# Patient Record
Sex: Male | Born: 1965 | Race: White | Hispanic: No | Marital: Married | Smoking: Never smoker
Health system: Southern US, Community
[De-identification: ages and names within clinical notes are randomized; demographics above are authoritative.]

## PROBLEM LIST (undated history)

## (undated) DIAGNOSIS — E7800 Pure hypercholesterolemia, unspecified: Secondary | ICD-10-CM

## (undated) DIAGNOSIS — S83242A Other tear of medial meniscus, current injury, left knee, initial encounter: Secondary | ICD-10-CM

## (undated) DIAGNOSIS — E663 Overweight: Secondary | ICD-10-CM

## (undated) DIAGNOSIS — M722 Plantar fascial fibromatosis: Secondary | ICD-10-CM

## (undated) DIAGNOSIS — M75100 Unspecified rotator cuff tear or rupture of unspecified shoulder, not specified as traumatic: Secondary | ICD-10-CM

## (undated) HISTORY — DX: Other tear of medial meniscus, current injury, left knee, initial encounter: S83.242A

## (undated) HISTORY — DX: Overweight: E66.3

## (undated) HISTORY — DX: Plantar fascial fibromatosis: M72.2

## (undated) HISTORY — PX: MENISCUS REPAIR: SHX5179

## (undated) HISTORY — PX: ROTATOR CUFF REPAIR: SHX139

## (undated) HISTORY — PX: TONSILLECTOMY: SUR1361

---
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
## 2009-02-08 ENCOUNTER — Ambulatory Visit: Payer: Self-pay | Admitting: General Practice

## 2010-01-22 ENCOUNTER — Ambulatory Visit: Payer: Self-pay | Admitting: General Practice

## 2012-05-20 ENCOUNTER — Ambulatory Visit: Payer: Self-pay | Admitting: General Practice

## 2012-05-21 ENCOUNTER — Ambulatory Visit: Payer: Self-pay | Admitting: General Practice

## 2012-05-28 ENCOUNTER — Ambulatory Visit: Payer: Self-pay | Admitting: Sports Medicine

## 2012-12-17 ENCOUNTER — Ambulatory Visit: Payer: Self-pay | Admitting: General Practice

## 2013-01-20 DIAGNOSIS — M75100 Unspecified rotator cuff tear or rupture of unspecified shoulder, not specified as traumatic: Secondary | ICD-10-CM

## 2013-01-20 HISTORY — DX: Unspecified rotator cuff tear or rupture of unspecified shoulder, not specified as traumatic: M75.100

## 2013-12-29 ENCOUNTER — Ambulatory Visit: Payer: Self-pay | Admitting: General Practice

## 2014-03-20 ENCOUNTER — Ambulatory Visit: Payer: Self-pay | Admitting: General Practice

## 2015-01-09 ENCOUNTER — Ambulatory Visit
Admit: 2015-01-09 | Disposition: A | Discharge status: Home / Self Care | Payer: Self-pay | Attending: General Practice | Admitting: General Practice

## 2015-05-02 ENCOUNTER — Other Ambulatory Visit: Payer: Self-pay | Admitting: Physician Assistant

## 2015-05-02 DIAGNOSIS — M25512 Pain in left shoulder: Secondary | ICD-10-CM

## 2015-05-08 DIAGNOSIS — M5030 Other cervical disc degeneration, unspecified cervical region: Secondary | ICD-10-CM | POA: Insufficient documentation

## 2015-05-08 DIAGNOSIS — M62838 Other muscle spasm: Secondary | ICD-10-CM | POA: Insufficient documentation

## 2015-05-10 ENCOUNTER — Ambulatory Visit: Payer: BLUE CROSS/BLUE SHIELD

## 2016-02-21 ENCOUNTER — Encounter: Payer: Self-pay | Admitting: *Deleted

## 2016-02-22 ENCOUNTER — Encounter: Payer: Self-pay | Admitting: *Deleted

## 2016-02-22 ENCOUNTER — Ambulatory Visit
Admission: RE | Admit: 2016-02-22 | Discharge: 2016-02-22 | Disposition: A | Discharge status: Home / Self Care | Payer: BLUE CROSS/BLUE SHIELD | Source: Ambulatory Visit | Attending: Gastroenterology | Admitting: Gastroenterology

## 2016-02-22 ENCOUNTER — Ambulatory Visit: Payer: BLUE CROSS/BLUE SHIELD | Admitting: Anesthesiology

## 2016-02-22 ENCOUNTER — Encounter
Admission: RE | Disposition: A | Discharge status: Home / Self Care | Payer: Self-pay | Source: Ambulatory Visit | Attending: Gastroenterology

## 2016-02-22 DIAGNOSIS — E7800 Pure hypercholesterolemia, unspecified: Secondary | ICD-10-CM | POA: Insufficient documentation

## 2016-02-22 DIAGNOSIS — K5730 Diverticulosis of large intestine without perforation or abscess without bleeding: Secondary | ICD-10-CM | POA: Insufficient documentation

## 2016-02-22 DIAGNOSIS — K529 Noninfective gastroenteritis and colitis, unspecified: Secondary | ICD-10-CM | POA: Diagnosis not present

## 2016-02-22 DIAGNOSIS — Z1211 Encounter for screening for malignant neoplasm of colon: Secondary | ICD-10-CM | POA: Insufficient documentation

## 2016-02-22 DIAGNOSIS — K640 First degree hemorrhoids: Secondary | ICD-10-CM | POA: Insufficient documentation

## 2016-02-22 HISTORY — PX: COLONOSCOPY WITH PROPOFOL: SHX5780

## 2016-02-22 HISTORY — DX: Pure hypercholesterolemia, unspecified: E78.00

## 2016-02-22 LAB — HM COLONOSCOPY

## 2016-02-22 SURGERY — COLONOSCOPY WITH PROPOFOL
Anesthesia: General

## 2016-02-22 MED ORDER — SODIUM CHLORIDE 0.9 % IV SOLN
INTRAVENOUS | Status: DC
  Administered 2016-02-22 (×2): via INTRAVENOUS

## 2016-02-22 MED ORDER — PROPOFOL 500 MG/50ML IV EMUL
INTRAVENOUS | Status: DC | PRN
  Administered 2016-02-22: 150 ug/kg/min via INTRAVENOUS

## 2016-02-22 MED ORDER — SODIUM CHLORIDE 0.9 % IV SOLN: INTRAVENOUS | Status: DC

## 2016-02-22 MED ORDER — PROPOFOL 10 MG/ML IV BOLUS
INTRAVENOUS | Status: DC | PRN
  Administered 2016-02-22: 80 mg via INTRAVENOUS

## 2016-02-22 MED ORDER — MIDAZOLAM HCL 2 MG/2ML IJ SOLN
INTRAMUSCULAR | Status: DC | PRN
  Administered 2016-02-22: 1 mg via INTRAVENOUS

## 2016-02-22 MED ORDER — FENTANYL CITRATE (PF) 100 MCG/2ML IJ SOLN
INTRAMUSCULAR | Status: DC | PRN
  Administered 2016-02-22: 50 ug via INTRAVENOUS

## 2016-02-22 NOTE — Anesthesia Procedure Notes (Signed)
Date/Time: 02/22/2016 9:54 AM Performed by: Junious SilkNOLES, Rozelle Caudle Pre-anesthesia Checklist: Patient identified, Emergency Drugs available, Suction available, Patient being monitored and Timeout performed Oxygen Delivery Method: Nasal cannula

## 2016-02-22 NOTE — Anesthesia Postprocedure Evaluation (Signed)
Anesthesia Post Note  Patient: Kevin Berry  Procedure(s) Performed: Procedure(s) (LRB): COLONOSCOPY WITH PROPOFOL (N/A)  Patient location during evaluation: Endoscopy Anesthesia Type: General Level of consciousness: awake and alert Pain management: pain level controlled Vital Signs Assessment: post-procedure vital signs reviewed and stable Respiratory status: spontaneous breathing, nonlabored ventilation, respiratory function stable and patient connected to nasal cannula oxygen Cardiovascular status: blood pressure returned to baseline and stable Postop Assessment: no signs of nausea or vomiting Anesthetic complications: no    Last Vitals:  Filed Vitals:   02/22/16 1030 02/22/16 1040  BP: 97/69 105/74  Pulse: 59 56  Temp:    Resp: 18 19    Last Pain: There were no vitals filed for this visit.               Graceann Boileau S

## 2016-02-22 NOTE — H&P (Signed)
Outpatient short stay form Pre-procedure 02/22/2016 9:26 AM Christena DeemMartin U Chivon Lepage MD  Primary Physician: Dr. Tarri AbernethyJoseph Rabinowitz  Reason for visit:  Screening colonoscopy  History of present illness:  Patient is a 50 year old male presenting today for screening colonoscopy. He tolerated his prep well. He takes no aspirin or blood thinning products.    Current facility-administered medications:  .  0.9 %  sodium chloride infusion, , Intravenous, Continuous, Christena DeemMartin U Jerard Bays, MD, Last Rate: 20 mL/hr at 02/22/16 0918 .  0.9 %  sodium chloride infusion, , Intravenous, Continuous, Christena DeemMartin U Kalysta Kneisley, MD  Prescriptions prior to admission  Medication Sig Dispense Refill Last Dose  . Multiple Vitamins-Minerals (MULTIVITAMIN PO) Take by mouth.   Past Week at Unknown time  . Omega-3 Fatty Acids (FISH OIL PO) Take by mouth.   Past Week at Unknown time     Allergies  Allergen Reactions  . Sulfa Antibiotics Other (See Comments)    Fever     Past Medical History  Diagnosis Date  . Hypercholesterolemia     Review of systems:      Physical Exam    Heart and lungs: Regular rate and rhythm without rub or gallop, lungs are bilaterally clear.    HEENT: Normocephalic atraumatic eyes are anicteric    Other:     Pertinant exam for procedure: Soft nontender nondistended bowel sounds positive normoactive.    Planned proceedures: Colonoscopy and indicated procedures. I have discussed the risks benefits and complications of procedures to include not limited to bleeding, infection, perforation and the risk of sedation and the patient wishes to proceed.    Christena DeemMartin U Skylen Danielsen, MD Gastroenterology 02/22/2016  9:26 AM

## 2016-02-22 NOTE — Transfer of Care (Signed)
Immediate Anesthesia Transfer of Care Note  Patient: Kevin Berry  Procedure(s) Performed: Procedure(s): COLONOSCOPY WITH PROPOFOL (N/A)  Patient Location: PACU  Anesthesia Type:General  Level of Consciousness: sedated  Airway & Oxygen Therapy: Patient Spontanous Breathing and Patient connected to nasal cannula oxygen  Post-op Assessment: Report given to RN and Post -op Vital signs reviewed and stable  Post vital signs: Reviewed and stable  Last Vitals:  Filed Vitals:   02/22/16 0901  BP: 128/76  Pulse: 66  Temp: 36.7 C  Resp: 24    Last Pain: There were no vitals filed for this visit.       Complications: No apparent anesthesia complications

## 2016-02-22 NOTE — Anesthesia Preprocedure Evaluation (Signed)
Anesthesia Evaluation  Patient identified by MRN, date of birth, ID band Patient awake    Reviewed: Allergy & Precautions, NPO status , Patient's Chart, lab work & pertinent test results, reviewed documented beta blocker date and time   Airway Mallampati: II  TM Distance: >3 FB     Dental  (+) Chipped   Pulmonary          Cardiovascular     Neuro/Psych    GI/Hepatic   Endo/Other    Renal/GU      Musculoskeletal   Abdominal   Peds  Hematology   Anesthesia Other Findings   Reproductive/Obstetrics                             Anesthesia Physical Anesthesia Plan  ASA: II  Anesthesia Plan: General   Post-op Pain Management:    Induction: Intravenous  Airway Management Planned: Nasal Cannula  Additional Equipment:   Intra-op Plan:   Post-operative Plan:   Informed Consent: I have reviewed the patients History and Physical, chart, labs and discussed the procedure including the risks, benefits and alternatives for the proposed anesthesia with the patient or authorized representative who has indicated his/her understanding and acceptance.     Plan Discussed with: CRNA  Anesthesia Plan Comments:         Anesthesia Quick Evaluation  

## 2016-02-22 NOTE — Op Note (Signed)
[facility] Gastroenterology Patient Name: Kevin Berry Procedure Date: 02/22/2016 9:37 AM MRN: [id number] Account #: [id number] Date of Birth: [birth year] Admit Type: Outpatient Age: 50 Room: [facility] ENDO ROOM 3 Gender: Male Note Status: Finalized Procedure:            Colonoscopy Indications:          Screening for colorectal malignant neoplasm Providers:            Christena Deem, MD Referring MD:         Bunnie Philips. Teressa Senter (Referring MD), Jabier Mutton, MD (Referring MD) Medicines:            Monitored Anesthesia Care Complications:        No immediate complications. Procedure:            Pre-Anesthesia Assessment:                       - ASA Grade Assessment: II - A patient with mild                        systemic disease.                       After obtaining informed consent, the colonoscope was                        passed under direct vision. Throughout the procedure,                        the patient's blood pressure, pulse, and oxygen                        saturations were monitored continuously. The                        Colonoscope was introduced through the anus and                        advanced to the the terminal ileum. The quality of the                        bowel preparation was good. Findings:      A localized area of variant mucosa/texture change was found at the       posterior edge of the innside of the ileocecal valve. Biopsies were       taken with a cold forceps for histology.      The remainder of the exam in the terminal ileum was normal.      A few small-mouthed diverticula were found in the sigmoid colon and       descending colon.      Non-bleeding internal hemorrhoids were found during retroflexion and       during anoscopy. The hemorrhoids were small and Grade I (internal       hemorrhoids that do not prolapse).      The digital rectal exam was normal.      No additional abnormalities were  found on retroflexion. Impression:           - Mucosa at the ileocecal valve. Biopsied.                       -  Diverticulosis in the sigmoid colon and in the                        descending colon.                       - Non-bleeding internal hemorrhoids. Recommendation:       - Discharge patient to home. Procedure Code(s):    --- Professional ---                       [phone], Colonoscopy, flexible; with biopsy, single or                        multiple Diagnosis Code(s):    --- Professional ---                       Z12.11, Encounter for screening for malignant neoplasm                        of colon                       K64.0, First degree hemorrhoids                       K57.30, Diverticulosis of large intestine without                        perforation or abscess without bleeding CPT copyright 2016 American Medical Association. All rights reserved. The codes documented in this report are preliminary and upon coder review may  be revised to meet current compliance requirements. Christena DeemMartin U Skulskie, MD 02/22/2016 10:12:08 AM This report has been signed electronically. Number of Addenda: 0 Note Initiated On: 02/22/2016 9:37 AM Scope Withdrawal Time: 0 hours 12 minutes 21 seconds  Total Procedure Duration: 0 hours 17 minutes 5 seconds       [facility]

## 2016-02-25 ENCOUNTER — Encounter: Payer: Self-pay | Admitting: Gastroenterology

## 2016-02-25 LAB — SURGICAL PATHOLOGY

## 2016-04-10 LAB — PSA: PSA: 1.1

## 2016-04-10 LAB — LIPID PANEL
Cholesterol: 215 — AB (ref 0–200)
HDL: 46 (ref 35–70)
LDL Cholesterol: 126
Triglycerides: 213 — AB (ref 40–160)

## 2017-05-18 LAB — PSA: PSA: 1.1

## 2017-05-18 LAB — LIPID PANEL
Cholesterol: 216 — AB (ref 0–200)
HDL: 40 (ref 35–70)
LDL Cholesterol: 106
Triglycerides: 348 — AB (ref 40–160)

## 2018-04-27 LAB — LIPID PANEL
Cholesterol: 195 (ref 0–200)
Cholesterol: 195 (ref 0–200)
HDL: 39 (ref 35–70)
HDL: 39 (ref 35–70)
LDL Cholesterol: 125
LDL Cholesterol: 125
Triglycerides: 155 (ref 40–160)
Triglycerides: 155 (ref 40–160)

## 2018-04-27 LAB — PSA: PSA: 0.8

## 2019-01-18 ENCOUNTER — Ambulatory Visit
Admission: RE | Admit: 2019-01-18 | Discharge: 2019-01-18 | Disposition: A | Discharge status: Home / Self Care | Payer: BLUE CROSS/BLUE SHIELD | Attending: Internal Medicine | Admitting: Internal Medicine

## 2019-01-18 ENCOUNTER — Ambulatory Visit
Admission: RE | Admit: 2019-01-18 | Discharge: 2019-01-18 | Disposition: A | Discharge status: Home / Self Care | Payer: BLUE CROSS/BLUE SHIELD | Source: Ambulatory Visit | Attending: Internal Medicine | Admitting: Internal Medicine

## 2019-01-18 ENCOUNTER — Other Ambulatory Visit: Payer: Self-pay

## 2019-01-18 ENCOUNTER — Other Ambulatory Visit: Payer: Self-pay | Admitting: Internal Medicine

## 2019-01-18 DIAGNOSIS — R52 Pain, unspecified: Secondary | ICD-10-CM

## 2019-01-18 DIAGNOSIS — M25532 Pain in left wrist: Secondary | ICD-10-CM | POA: Insufficient documentation

## 2019-03-14 DIAGNOSIS — M25532 Pain in left wrist: Secondary | ICD-10-CM | POA: Insufficient documentation

## 2019-04-05 ENCOUNTER — Other Ambulatory Visit: Payer: Self-pay

## 2019-04-05 ENCOUNTER — Ambulatory Visit: Payer: 59

## 2019-04-05 DIAGNOSIS — Z0189 Encounter for other specified special examinations: Secondary | ICD-10-CM

## 2019-04-05 LAB — POCT URINALYSIS DIPSTICK
Bilirubin, UA: NEGATIVE
Blood, UA: NEGATIVE
Glucose, UA: NEGATIVE
Nitrite, UA: NEGATIVE
Protein, UA: POSITIVE — AB
Spec Grav, UA: 1.025 (ref 1.010–1.025)
Urobilinogen, UA: 0.2 E.U./dL
pH, UA: 6 (ref 5.0–8.0)

## 2019-04-06 LAB — CMP12+LP+TP+TSH+6AC+PSA+CBC…
ALT: 17 IU/L (ref 0–44)
AST: 16 IU/L (ref 0–40)
Albumin/Globulin Ratio: 2 (ref 1.2–2.2)
Albumin: 4.3 g/dL (ref 3.8–4.9)
Alkaline Phosphatase: 44 IU/L (ref 39–117)
BUN/Creatinine Ratio: 16 (ref 9–20)
BUN: 17 mg/dL (ref 6–24)
Basophils Absolute: 0.1 10*3/uL (ref 0.0–0.2)
Basos: 1 %
Bilirubin Total: 0.6 mg/dL (ref 0.0–1.2)
Calcium: 8.9 mg/dL (ref 8.7–10.2)
Chloride: 106 mmol/L (ref 96–106)
Chol/HDL Ratio: 5.3 ratio — ABNORMAL HIGH (ref 0.0–5.0)
Cholesterol, Total: 202 mg/dL — ABNORMAL HIGH (ref 100–199)
Creatinine, Ser: 1.06 mg/dL (ref 0.76–1.27)
EOS (ABSOLUTE): 0.2 10*3/uL (ref 0.0–0.4)
Eos: 5 %
Estimated CHD Risk: 1.1 times avg. — ABNORMAL HIGH (ref 0.0–1.0)
Free Thyroxine Index: 1.4 (ref 1.2–4.9)
GFR calc Af Amer: 92 mL/min/{1.73_m2} (ref >59)
GFR calc non Af Amer: 79 mL/min/{1.73_m2} (ref >59)
GGT: 19 IU/L (ref 0–65)
Globulin, Total: 2.2 g/dL (ref 1.5–4.5)
Glucose: 86 mg/dL (ref 65–99)
HDL: 38 mg/dL — ABNORMAL LOW (ref >39)
Hematocrit: 46.4 % (ref 37.5–51.0)
Hemoglobin: 15.8 g/dL (ref 13.0–17.7)
Immature Grans (Abs): 0 10*3/uL (ref 0.0–0.1)
Immature Granulocytes: 0 %
Iron: 143 ug/dL (ref 38–169)
LDH: 164 IU/L (ref 121–224)
LDL Calculated: 133 mg/dL — ABNORMAL HIGH (ref 0–99)
Lymphocytes Absolute: 1.5 10*3/uL (ref 0.7–3.1)
Lymphs: 33 %
MCH: 31 pg (ref 26.6–33.0)
MCHC: 34.1 g/dL (ref 31.5–35.7)
MCV: 91 fL (ref 79–97)
Monocytes Absolute: 0.5 10*3/uL (ref 0.1–0.9)
Monocytes: 12 %
Neutrophils Absolute: 2.3 10*3/uL (ref 1.4–7.0)
Neutrophils: 49 %
Phosphorus: 3 mg/dL (ref 2.8–4.1)
Platelets: 250 10*3/uL (ref 150–450)
Potassium: 4.5 mmol/L (ref 3.5–5.2)
Prostate Specific Ag, Serum: 0.7 ng/mL (ref 0.0–4.0)
RBC: 5.1 x10E6/uL (ref 4.14–5.80)
RDW: 12.4 % (ref 11.6–15.4)
Sodium: 143 mmol/L (ref 134–144)
T3 Uptake Ratio: 30 % (ref 24–39)
T4, Total: 4.7 ug/dL (ref 4.5–12.0)
TSH: 1.43 u[IU]/mL (ref 0.450–4.500)
Total Protein: 6.5 g/dL (ref 6.0–8.5)
Triglycerides: 155 mg/dL — ABNORMAL HIGH (ref 0–149)
Uric Acid: 6.5 mg/dL (ref 3.7–8.6)
VLDL Cholesterol Cal: 31 mg/dL (ref 5–40)
WBC: 4.7 10*3/uL (ref 3.4–10.8)

## 2019-04-07 ENCOUNTER — Ambulatory Visit: Payer: 59 | Admitting: Internal Medicine

## 2019-04-07 ENCOUNTER — Other Ambulatory Visit: Payer: Self-pay

## 2019-04-07 ENCOUNTER — Encounter: Payer: Self-pay | Admitting: Internal Medicine

## 2019-04-07 VITALS — BP 114/82 | HR 77 | Temp 97.3°F | Resp 14 | Ht 74.0 in | Wt 211.0 lb

## 2019-04-07 DIAGNOSIS — R809 Proteinuria, unspecified: Secondary | ICD-10-CM

## 2019-04-07 DIAGNOSIS — R82998 Other abnormal findings in urine: Secondary | ICD-10-CM

## 2019-04-07 DIAGNOSIS — E663 Overweight: Secondary | ICD-10-CM

## 2019-04-07 DIAGNOSIS — S6982XA Other specified injuries of left wrist, hand and finger(s), initial encounter: Secondary | ICD-10-CM | POA: Insufficient documentation

## 2019-04-07 DIAGNOSIS — S6982XD Other specified injuries of left wrist, hand and finger(s), subsequent encounter: Secondary | ICD-10-CM

## 2019-04-07 DIAGNOSIS — E7849 Other hyperlipidemia: Secondary | ICD-10-CM | POA: Insufficient documentation

## 2019-04-07 LAB — POCT URINALYSIS DIPSTICK
Bilirubin, UA: NEGATIVE
Blood, UA: NEGATIVE
Glucose, UA: NEGATIVE
Nitrite, UA: NEGATIVE
Protein, UA: NEGATIVE
Spec Grav, UA: 1.015 (ref 1.010–1.025)
Urobilinogen, UA: 0.2 E.U./dL
pH, UA: 7 (ref 5.0–8.0)

## 2019-04-07 NOTE — Progress Notes (Signed)
Kevin Berry  - 53 y.o. male who presents for annual physical evaluation He was tested 6/20 at [facility]), and was negative for Covid, went due to secondary exposure concern, not direct.  I last saw for f/u wrist pain - 5/14, concern for TFCC injury and referred to ortho. He saw ortho and they treated with splint, agreed felt a TFCC concern and if not improving on f/u planned in August, may do injection or MRI at that point. He thinks it has slowly improved some to date, but still not resolved.  No other complaints, denies any recent CP, palpitations, SOB, abdominal pains, change in bowel habits, dark/black stools, vision changes, recent fevers or Covid concerning sx'Kevin Berry, up 2X/night to urinate, normal for him, no urgency, frequency concerns, no dysuria, no h/o prostate issues, no LE swelling, no joint swellings, his wrist is slowly improving as above noted, no other concerning sx'Kevin Berry noted.  Exercise - does daily Diet - notes tries to watch weight and eat healthy, takes protein shakes  Allergies  Allergen Reactions  . Sulfa Antibiotics Other (See Comments)    Fever    Meds reviewed Current Outpatient Medications on File Prior to Visit  Medication Sig Dispense Refill  . Multiple Vitamins-Minerals (MULTIVITAMIN PO) Take by mouth.    . Omega-3 Fatty Acids (FISH OIL PO) Take by mouth.     No current facility-administered medications on file prior to visit.      Social History   Tobacco Use  Smoking Status Never Smoker  Smokeless Tobacco Never Used     FH - M - mac degen, F - DM,low thyroid,   O - NAD, masked BP 114/82 (BP Location: Right Arm, Patient Position: Sitting, Cuff Size: Large)   Pulse 77   Temp (!) 97.3 F (36.3 C) (Oral)   Resp 14   Ht _0  (1.88 m)   Wt 211 lb (95.7 kg)   SpO2 95%   BMI 27.09 kg/m   Weight - 207 - 04/2018 visit  HEENT - sclera anicteric, + glasses, PERRL, EOMI, conj - non-inj'ed, TM'Kevin Berry and canals clear Neck - supple, no adenopathy, no TM, carotids 2+ and  =, no bruits Car - RRR without m/g/r Pulm- CTA without wheeze or rales Abd - soft, NT, ND, BS+, no obvious HSM, no masses Back - no CVA tenderness Skin- no new lesions of concern on exposed areas, denied otherwise Ext - no LE edema, no active joints, left wrist limited when testing RAMs, not fully re-examine left wrist in detail today GU - no swelling in inguinal/suprapubic region, NT, prostate not enlarged, NT, no nodules Neuro - affect was not flat, appropriate with conversation  Grossly non-focal with good strength on testing, sensation intact to LT in distal extremities, Romberg neg, no pronator drift, good balance on one foot, good finger to nose, good RAMs on right, left limited with wrist concern.  Labs reviewed - had small protein and leukocytes, 1+ ketone on urine initial test, repeated this am in office - still samll leukocytes, protein neg, still 1+ ketone, TC - 202, TG - 155, HDL - 38, LDL - 133 [phone] on check 04/2018), PSA - 0.7  ECG reviewed - no concerning changes from prior ECG Colonoscopy screening discussed and reviewed, last one 02/2016 and repeat \rec'ed in 10 years for screening  Ass/Plan - 1.  Hyperlipidemia - very small worsening in numbers noted  Educated on the role of medications, risk/benefits and a statin discussed as possible addition. Cardiac risk profile low  risk otherwise, and will continue to monitor short term. Noted the potential benefits of a statin and if reads more about them and decides want to initiate in the near future, will let me know and I will prescribe Importance of diet modifications emphasized, limiting saturated fats and reducing calories from fats helpful Importance of continuing regular aerobic exercise noted Above to help with weight control also very important   2. Overweight - BMI 27.09  Noted by BMI is overweight and importance really of weight maintenance rather than significant weight loss over time.    3. Left wrist TFCC  sprain  Saw ortho and treating conservatively, and f/u with them again in August and await response  4. Small leukocytes in urine - prostate exam normal without concerns for sx'ic prostatitis. No symptoms raising UTI concerns.   Discussed this with him and will monitor more closely for any possible early signs of infectious/prostate concerns (up more at night urinating, urgency sx'Kevin Berry, any suprapubic discomfort or dysuria), and if any arise to F/u.  5. Mild proteinuria/ketones in urine - rechecked urine today and negative for protein, no further w/u needed. Ketones in urine possibly due to hydration status when tested, no glucose and blood sugar normal when tested.  6. Discussed next colonoscopy not due til 2027

## 2019-06-24 ENCOUNTER — Ambulatory Visit: Payer: Self-pay

## 2019-06-24 DIAGNOSIS — Z23 Encounter for immunization: Secondary | ICD-10-CM

## 2019-07-13 DIAGNOSIS — H524 Presbyopia: Secondary | ICD-10-CM | POA: Diagnosis not present

## 2019-09-21 ENCOUNTER — Ambulatory Visit: Payer: Self-pay | Admitting: Physician Assistant

## 2019-09-21 ENCOUNTER — Other Ambulatory Visit: Payer: Self-pay

## 2019-09-21 VITALS — BP 124/96 | HR 70 | Temp 98.2°F | Ht 75.0 in | Wt 216.6 lb

## 2019-09-21 DIAGNOSIS — M542 Cervicalgia: Secondary | ICD-10-CM

## 2019-09-21 MED ORDER — CYCLOBENZAPRINE HCL 10 MG PO TABS: 10.0000 mg | ORAL_TABLET | Freq: Three times a day (TID) | ORAL | 0 refills | Status: DC | PRN

## 2019-09-21 MED ORDER — METHYLPREDNISOLONE 4 MG PO TBPK: ORAL_TABLET | ORAL | 0 refills | Status: DC

## 2019-09-21 NOTE — Progress Notes (Signed)
Neck and right upper shoulder pain x 2 weeks. HA x 2 weeks

## 2019-09-21 NOTE — Progress Notes (Signed)
   Subjective: Pinched nerve    Patient ID: Kevin Berry, male    DOB: 03-19-1966, 53 y.o.   MRN: [id number]  HPI Patient presents with 2 weeks of radicular component to the right upper extremity.  Patient onset was after lifting incident.  Patient states pain wax and wane but has increased in severity.  Patient denies loss of strength or function of the right upper extremity.  Patient the pain increases with left lateral motions of the neck.  Patient state blood pressure taken today is higher than normal readings.  Patient denies headaches associated with complaint.  Patient has patient disturbance of vertigo.  Review of Systems Negative except for complaint.    Objective:   Physical Exam Patient appears no acute distress.  Patient is full neck range of motion of the cervical spine.  Patient is nontender palpation of spinal processes.  Patient has full equal range of motion of the upper extremities.  Strength is 5/5 bilaterally.  Peripheral pulses are intact.  Patient blood pressure was 142/100.  Advised patient we will retake his blood pressure before he departed clinic.       Assessment & Plan: Cervical radiculopathy.  Patient agreeable to a trial of tapered steroids and muscle relaxers for 5 days.  Patient advised return back in 1 week if no improvement.  Departure blood pressure was 124/96. Will schedule for 3 day BP reading next week.

## 2019-09-26 ENCOUNTER — Other Ambulatory Visit: Payer: Self-pay

## 2019-09-26 ENCOUNTER — Ambulatory Visit: Payer: Self-pay

## 2019-09-26 VITALS — BP 124/80

## 2019-09-26 DIAGNOSIS — R030 Elevated blood-pressure reading, without diagnosis of hypertension: Secondary | ICD-10-CM

## 2019-09-26 NOTE — Progress Notes (Signed)
BP elevated at 09/21/2019 visit with Durward Parcel, PA-C.  He requested Genelle Bal return to clinic for follow-up BP.  AMD

## 2019-09-27 ENCOUNTER — Ambulatory Visit: Payer: Self-pay

## 2019-09-27 VITALS — BP 122/70

## 2019-09-27 DIAGNOSIS — R030 Elevated blood-pressure reading, without diagnosis of hypertension: Secondary | ICD-10-CM

## 2019-09-27 NOTE — Progress Notes (Signed)
2nd day  BP of 3 BP checks requested by Durward Parcel, PA-C.  AMD

## 2019-09-28 ENCOUNTER — Other Ambulatory Visit: Payer: Self-pay

## 2019-09-28 ENCOUNTER — Ambulatory Visit: Payer: Self-pay

## 2019-09-28 VITALS — BP 122/78

## 2019-09-28 DIAGNOSIS — R030 Elevated blood-pressure reading, without diagnosis of hypertension: Secondary | ICD-10-CM

## 2019-09-28 NOTE — Progress Notes (Signed)
3rd days of blood pressure checks requested by Durward Parcel, PA-C (Interim Provider).  All three days have been WNL & he states "I feel fine - I feel great".  AMD

## 2019-09-29 ENCOUNTER — Telehealth: Payer: Self-pay

## 2019-09-29 NOTE — Telephone Encounter (Signed)
Noted and agreed with plan of care.  Reviewed Epic and [facility] paper chart on 09/27/2018 and discussed verbally with RN Paschal Dopp plan of care for patient.  Patient to follow up if new or worsening symptoms for re-evaluation especially loss of bowel/bladder control, saddle paresthesias or arm/leg weakness to see provider same day.

## 2019-09-29 NOTE — Telephone Encounter (Signed)
Received message from [place] as follows on 09/27/2019 -  "The PA I saw last week mentioned letting him know if the Methylprednisolone treatment fixed the pinched nerve issue.  Let him know that it is better, but we're not there yet.  The pain os probably 50% gone & I finished the six day course this morning".  Durward Parcel, PA-C (Interim Provider) is who saw Kevin Berry & prescribed the prednisone.  He doesn't work in the [facility] again until 10/12/2019.  Albina Billet, NP-C (Interim Provider) covered the [facility] 09/28/2019 reviewed the message, Ron's notes & chart. She saw where patient had a similar situation in 2016 & was referred to Dr. Yves Dill who gave him a trigger point injection. Recommended to either contact Dr. Yves Dill (if had good results the last time) to see if they can see him & get a trigger point injection or contact [facility] for a trigger point injection.  Sent Kevin Berry a message through his Mychart with the above recommendations.  AMD

## 2019-11-12 ENCOUNTER — Ambulatory Visit: Payer: Self-pay | Attending: Internal Medicine

## 2019-11-12 ENCOUNTER — Ambulatory Visit: Payer: BLUE CROSS/BLUE SHIELD

## 2019-11-12 DIAGNOSIS — Z23 Encounter for immunization: Secondary | ICD-10-CM | POA: Insufficient documentation

## 2019-11-12 NOTE — Progress Notes (Signed)
   Covid-19 Vaccination Clinic  Name:  Kevin Berry    MRN: [id number] DOB: 05-14-1966  11/12/2019  [name] was observed post Covid-19 immunization for 15 minutes without incidence. He was provided with Vaccine Information Sheet and instruction to access the V-Safe system.   [name] was instructed to call 911 with any severe reactions post vaccine: Marland Kitchen Difficulty breathing  . Swelling of your face and throat  . A fast heartbeat  . A bad rash all over your body  . Dizziness and weakness    Immunizations Administered    Name Date Dose VIS Date Route   Pfizer COVID-19 Vaccine 11/12/2019  8:44 AM 0.3 mL 09/02/2019 Intramuscular   Manufacturer: [employer], [employer]   Lot: [id number]   NDC: [address]-9

## 2019-11-23 DIAGNOSIS — Z20828 Contact with and (suspected) exposure to other viral communicable diseases: Secondary | ICD-10-CM | POA: Diagnosis not present

## 2019-12-06 ENCOUNTER — Ambulatory Visit: Payer: Self-pay | Attending: Internal Medicine

## 2019-12-06 DIAGNOSIS — Z23 Encounter for immunization: Secondary | ICD-10-CM

## 2020-03-28 NOTE — Progress Notes (Signed)
Scheduled to complete physical 04/05/20 with Ron Smith, PA-C.  AMD 

## 2020-03-29 ENCOUNTER — Other Ambulatory Visit: Payer: Self-pay

## 2020-03-29 ENCOUNTER — Ambulatory Visit: Payer: Self-pay

## 2020-03-29 DIAGNOSIS — Z0000 Encounter for general adult medical examination without abnormal findings: Secondary | ICD-10-CM

## 2020-03-29 LAB — POCT URINALYSIS DIPSTICK
Bilirubin, UA: NEGATIVE
Blood, UA: NEGATIVE
Glucose, UA: NEGATIVE
Ketones, UA: NEGATIVE
Leukocytes, UA: NEGATIVE
Nitrite, UA: NEGATIVE
Protein, UA: NEGATIVE
Spec Grav, UA: 1.015 (ref 1.010–1.025)
Urobilinogen, UA: 0.2 E.U./dL
pH, UA: 7 (ref 5.0–8.0)

## 2020-03-30 LAB — CMP12+LP+TP+TSH+6AC+PSA+CBC…
ALT: 21 IU/L (ref 0–44)
AST: 21 IU/L (ref 0–40)
Albumin/Globulin Ratio: 1.7 (ref 1.2–2.2)
Albumin: 4.3 g/dL (ref 3.8–4.9)
Alkaline Phosphatase: 51 IU/L (ref 48–121)
BUN/Creatinine Ratio: 19 (ref 9–20)
BUN: 19 mg/dL (ref 6–24)
Basophils Absolute: 0.1 10*3/uL (ref 0.0–0.2)
Basos: 2 %
Bilirubin Total: 0.6 mg/dL (ref 0.0–1.2)
Calcium: 9.5 mg/dL (ref 8.7–10.2)
Chloride: 104 mmol/L (ref 96–106)
Chol/HDL Ratio: 6 ratio — ABNORMAL HIGH (ref 0.0–5.0)
Cholesterol, Total: 239 mg/dL — ABNORMAL HIGH (ref 100–199)
Creatinine, Ser: 1.02 mg/dL (ref 0.76–1.27)
EOS (ABSOLUTE): 0.4 10*3/uL (ref 0.0–0.4)
Eos: 8 %
Estimated CHD Risk: 1.3 times avg. — ABNORMAL HIGH (ref 0.0–1.0)
Free Thyroxine Index: 1.5 (ref 1.2–4.9)
GFR calc Af Amer: 95 mL/min/{1.73_m2} (ref >59)
GFR calc non Af Amer: 82 mL/min/{1.73_m2} (ref >59)
GGT: 21 IU/L (ref 0–65)
Globulin, Total: 2.5 g/dL (ref 1.5–4.5)
Glucose: 84 mg/dL (ref 65–99)
HDL: 40 mg/dL (ref >39)
Hematocrit: 49.3 % (ref 37.5–51.0)
Hemoglobin: 16.6 g/dL (ref 13.0–17.7)
Immature Grans (Abs): 0 10*3/uL (ref 0.0–0.1)
Immature Granulocytes: 0 %
Iron: 190 ug/dL — ABNORMAL HIGH (ref 38–169)
LDH: 181 IU/L (ref 121–224)
LDL Chol Calc (NIH): 153 mg/dL — ABNORMAL HIGH (ref 0–99)
Lymphocytes Absolute: 1.8 10*3/uL (ref 0.7–3.1)
Lymphs: 34 %
MCH: 30.9 pg (ref 26.6–33.0)
MCHC: 33.7 g/dL (ref 31.5–35.7)
MCV: 92 fL (ref 79–97)
Monocytes Absolute: 0.6 10*3/uL (ref 0.1–0.9)
Monocytes: 11 %
Neutrophils Absolute: 2.4 10*3/uL (ref 1.4–7.0)
Neutrophils: 45 %
Phosphorus: 3.5 mg/dL (ref 2.8–4.1)
Platelets: 240 10*3/uL (ref 150–450)
Potassium: 4.9 mmol/L (ref 3.5–5.2)
Prostate Specific Ag, Serum: 0.7 ng/mL (ref 0.0–4.0)
RBC: 5.37 x10E6/uL (ref 4.14–5.80)
RDW: 12.4 % (ref 11.6–15.4)
Sodium: 143 mmol/L (ref 134–144)
T3 Uptake Ratio: 31 % (ref 24–39)
T4, Total: 4.8 ug/dL (ref 4.5–12.0)
TSH: 1.18 u[IU]/mL (ref 0.450–4.500)
Total Protein: 6.8 g/dL (ref 6.0–8.5)
Triglycerides: 247 mg/dL — ABNORMAL HIGH (ref 0–149)
Uric Acid: 6.2 mg/dL (ref 3.8–8.4)
VLDL Cholesterol Cal: 46 mg/dL — ABNORMAL HIGH (ref 5–40)
WBC: 5.3 10*3/uL (ref 3.4–10.8)

## 2020-04-05 ENCOUNTER — Ambulatory Visit: Payer: Self-pay | Admitting: Physician Assistant

## 2020-04-05 ENCOUNTER — Other Ambulatory Visit: Payer: Self-pay

## 2020-04-05 VITALS — BP 130/90 | HR 76 | Temp 98.5°F | Resp 14 | Ht 74.0 in | Wt 207.0 lb

## 2020-04-05 DIAGNOSIS — Z0000 Encounter for general adult medical examination without abnormal findings: Secondary | ICD-10-CM

## 2020-04-05 MED ORDER — ATORVASTATIN CALCIUM 40 MG PO TABS: 40.0000 mg | ORAL_TABLET | Freq: Every day | ORAL | 3 refills | Status: DC

## 2020-04-05 NOTE — Progress Notes (Signed)
   Subjective: Annual physical exam    Patient ID: Kevin Berry, male    DOB: [birth year], 54 y.o.   MRN: [id number]  HPI Patient presents annual physical exam voices no concerns or complaints.   Review of Systems Negative    Objective:   Physical Exam  No acute distress.  HEENT is unremarkable.  Neck is supple without adenopathy or bruits.  Lungs are clear to auscultation.  Heart regular rate and rhythm.  Abdomen negative HSM, normoactive bowel sounds, soft, and nontender palpation.  No obvious deformity to the upper or lower extremities.  Patient has full and equal range of motion of the upper and lower extremities.  No obvious deformity to the cervical lumbar spine.  Patient has full and equal range of motion of the cervical and lumbar spine.  Cranial nerves II through XII are grossly intact.      Assessment & Plan: Well exam  Discussed lab results with patient was consistent with hyperlipidemia.  Patient has tried diet, exercise and lifestyle changes follow-up success.  Patient is amenable to starting medication.  Prescription for Lipitor was written today.  Patient will follow up in 3 months.

## 2020-05-14 ENCOUNTER — Other Ambulatory Visit: Payer: Self-pay

## 2020-05-14 ENCOUNTER — Encounter: Payer: Self-pay | Admitting: Emergency Medicine

## 2020-05-14 ENCOUNTER — Ambulatory Visit: Payer: Self-pay | Admitting: Emergency Medicine

## 2020-05-14 VITALS — BP 124/81 | HR 78 | Temp 98.3°F | Resp 14 | Ht 74.0 in | Wt 212.0 lb

## 2020-05-14 DIAGNOSIS — M545 Low back pain, unspecified: Secondary | ICD-10-CM

## 2020-05-14 MED ORDER — MELOXICAM 15 MG PO TABS: 15.0000 mg | ORAL_TABLET | Freq: Every day | ORAL | 0 refills | Status: DC

## 2020-05-14 MED ORDER — CYCLOBENZAPRINE HCL 10 MG PO TABS: 10.0000 mg | ORAL_TABLET | Freq: Three times a day (TID) | ORAL | 0 refills | Status: DC | PRN

## 2020-05-14 NOTE — Progress Notes (Signed)
Pt states lower back has been hurting since last week. Pt stated he was mowing the yard when noticed the pain, other then that no hard physical activities was done to cause any pain. CL,RMA

## 2020-05-14 NOTE — Progress Notes (Signed)
Occupational Health Provider Note       Time seen: 2:46 PM    I have reviewed the vital signs and the nursing notes.  HISTORY   Chief Complaint Back Pain    HPI Kevin Berry is a 54 y.o. male with a history of hyperlipidemia, rotator cuff tear who presents for lower back pain has been bothering him all week.  Patient states he was mowing the yard when he noticed the pain.  No recent falls or specific trauma.  No fevers, no radicular pain.    Past Medical History:  Diagnosis Date  . Acute medial meniscus tear of left knee   . Hypercholesterolemia   . Overweight   . Plantar fasciitis   . Rotator cuff tear 01/2013    Past Surgical History:  Procedure Laterality Date  . COLONOSCOPY WITH PROPOFOL N/A 02/22/2016   Procedure: COLONOSCOPY WITH PROPOFOL;  Surgeon: Lollie Sails, MD;  Location: [facility] ENDOSCOPY;  Service: Endoscopy;  Laterality: N/A;  . MENISCUS REPAIR Left   . ROTATOR CUFF REPAIR Right   . TONSILLECTOMY      Allergies Sulfa antibiotics   Review of Systems Constitutional: Negative for fever. Cardiovascular: Negative for chest pain. Respiratory: Negative for shortness of breath. Gastrointestinal: Negative for abdominal pain, vomiting and diarrhea. Musculoskeletal: Positive for low back pain Skin: Negative for rash. Neurological: Negative for headaches, focal weakness or numbness.  All systems negative/normal/unremarkable except as stated in the HPI  ____________________________________________   PHYSICAL EXAM:  VITAL SIGNS: Vitals:   05/14/20 1430  BP: 124/81  Pulse: 78  Resp: 14  Temp: 98.3 F (36.8 C)  SpO2: 97%    Constitutional: Alert and oriented. Well appearing and in no distress. Cardiovascular: Normal rate, regular rhythm. No murmurs, rubs, or gallops. Respiratory: Normal respiratory effort without tachypnea nor retractions. Breath sounds are clear and equal bilaterally. No wheezes/rales/rhonchi. Gastrointestinal: Soft and  nontender. Normal bowel sounds Musculoskeletal: Nontender with normal range of motion in extremities. No lower extremity tenderness nor edema.  Tenderness at bilateral SI joints, right greater than left, negative cross and straight leg raise examination.  Raising the legs does elicit some pain in the low back. Neurologic:  Normal speech and language. No gross focal neurologic deficits are appreciated.  Skin:  Skin is warm, dry and intact. No rash noted. Psychiatric: Speech and behavior are normal.   ____________________________________________   LABS (pertinent positives/negatives)  Recent Results (from the past 2160 hour(s))  CMP12+LP+TP+TSH+6AC+PSA+CBC.     Status: Abnormal   Collection Time: 03/29/20  8:15 AM  Result Value Ref Range   Glucose 84 65 - 99 mg/dL   Uric Acid 6.2 3.8 - 8.4 mg/dL    Comment:            Therapeutic target for gout patients: <6.0   BUN 19 6 - 24 mg/dL   Creatinine, Ser 1.02 0.76 - 1.27 mg/dL   GFR calc non Af Amer 82 >59 mL/min/1.73   GFR calc Af Amer 95 >59 mL/min/1.73    Comment: **[facility] currently reports eGFR in compliance with the current**   recommendations of the [employer]. [facility] will   update reporting as new guidelines are published from the NKF-ASN   Task force.    BUN/Creatinine Ratio 19 9 - 20   Sodium 143 134 - 144 mmol/L   Potassium 4.9 3.5 - 5.2 mmol/L   Chloride 104 96 - 106 mmol/L   Calcium 9.5 8.7 - 10.2 mg/dL   Phosphorus 3.5  2.8 - 4.1 mg/dL   Total Protein 6.8 6.0 - 8.5 g/dL   Albumin 4.3 3.8 - 4.9 g/dL   Globulin, Total 2.5 1.5 - 4.5 g/dL   Albumin/Globulin Ratio 1.7 1.2 - 2.2   Bilirubin Total 0.6 0.0 - 1.2 mg/dL   Alkaline Phosphatase 51 48 - 121 IU/L   LDH 181 121 - 224 IU/L   AST 21 0 - 40 IU/L   ALT 21 0 - 44 IU/L   GGT 21 0 - 65 IU/L   Iron 190 (H) 38 - 169 ug/dL   Cholesterol, Total 239 (H) 100 - 199 mg/dL   Triglycerides 247 (H) 0 - 149 mg/dL   HDL 40 >39 mg/dL   VLDL Cholesterol Cal 46 (H) 5 -  40 mg/dL   LDL Chol Calc (NIH) 153 (H) 0 - 99 mg/dL   Chol/HDL Ratio 6.0 (H) 0.0 - 5.0 ratio    Comment:                                   T. Chol/HDL Ratio                                             Men  Women                               1/2 Avg.Risk  3.4    3.3                                   Avg.Risk  5.0    4.4                                2X Avg.Risk  9.6    7.1                                3X Avg.Risk 23.4   11.0    Estimated CHD Risk 1.3 (H) 0.0 - 1.0 times avg.    Comment: The CHD Risk is based on the T. Chol/HDL ratio. Other factors affect CHD Risk such as hypertension, smoking, diabetes, severe obesity, and family history of premature CHD.    TSH 1.180 0.450 - 4.500 uIU/mL   T4, Total 4.8 4.5 - 12.0 ug/dL   T3 Uptake Ratio 31 24 - 39 %   Free Thyroxine Index 1.5 1.2 - 4.9   Prostate Specific Ag, Serum 0.7 0.0 - 4.0 ng/mL    Comment: Roche ECLIA methodology. According to the American Urological Association, Serum PSA should decrease and remain at undetectable levels after radical prostatectomy. The AUA defines biochemical recurrence as an initial PSA value 0.2 ng/mL or greater followed by a subsequent confirmatory PSA value 0.2 ng/mL or greater. Values obtained with different assay methods or kits cannot be used interchangeably. Results cannot be interpreted as absolute evidence of the presence or absence of malignant disease.    WBC 5.3 3.4 - 10.8 x10E3/uL   RBC 5.37 4.14 - 5.80 x10E6/uL   Hemoglobin 16.6 13.0 - 17.7 g/dL   Hematocrit 49.3 37.5 -  51.0 %   MCV 92 79 - 97 fL   MCH 30.9 26.6 - 33.0 pg   MCHC 33.7 31 - 35 g/dL   RDW 12.4 11.6 - 15.4 %   Platelets 240 150 - 450 x10E3/uL   Neutrophils 45 Not Estab. %   Lymphs 34 Not Estab. %   Monocytes 11 Not Estab. %   Eos 8 Not Estab. %   Basos 2 Not Estab. %   Neutrophils Absolute 2.4 1 - 7 x10E3/uL   Lymphocytes Absolute 1.8 0 - 3 x10E3/uL   Monocytes Absolute 0.6 0 - 0 x10E3/uL   EOS (ABSOLUTE) 0.4  0.0 - 0.4 x10E3/uL   Basophils Absolute 0.1 0 - 0 x10E3/uL   Immature Granulocytes 0 Not Estab. %   Immature Grans (Abs) 0.0 0.0 - 0.1 x10E3/uL  POCT urinalysis dipstick     Status: None   Collection Time: 03/29/20  9:30 AM  Result Value Ref Range   Color, UA Dark Yellow    Clarity, UA Clear    Glucose, UA Negative Negative   Bilirubin, UA Negative    Ketones, UA Negative    Spec Grav, UA 1.015 1.010 - 1.025   Blood, UA Negative    pH, UA 7.0 5.0 - 8.0   Protein, UA Negative Negative   Urobilinogen, UA 0.2 0.2 or 1.0 E.U./dL   Nitrite, UA Negative    Leukocytes, UA Negative Negative   Appearance     Odor       ASSESSMENT AND PLAN  Sacroiliitis   Plan: The patient had presented for low back pain.  Clinically this seems to be mostly related to sacroiliitis.  He will be given anti-inflammatory and as needed muscle relaxants although NSAIDs would likely help the most.  I have encouraged stretching as well.  We will also obtain imaging of the lumbar spine today.  Lenise Arena MD    Note: This note was generated in part or whole with voice recognition software. Voice recognition is usually quite accurate but there are transcription errors that can and very often do occur. I apologize for any typographical errors that were not detected and corrected.

## 2020-06-03 ENCOUNTER — Other Ambulatory Visit: Payer: Self-pay | Admitting: Emergency Medicine

## 2020-06-03 DIAGNOSIS — M5030 Other cervical disc degeneration, unspecified cervical region: Secondary | ICD-10-CM

## 2020-07-02 NOTE — Progress Notes (Signed)
Physical 04/05/20 with Nona Dell, PA-C & started on Lipitor for hyperlipidemia. 3 month follow-up lipid panel & follow-up appt requested by Ron.  AMD

## 2020-07-03 ENCOUNTER — Other Ambulatory Visit: Payer: Self-pay

## 2020-07-03 DIAGNOSIS — E7849 Other hyperlipidemia: Secondary | ICD-10-CM

## 2020-07-04 LAB — LIPID PANEL
Chol/HDL Ratio: 3.2 ratio (ref 0.0–5.0)
Cholesterol, Total: 149 mg/dL (ref 100–199)
HDL: 46 mg/dL (ref >39)
LDL Chol Calc (NIH): 76 mg/dL (ref 0–99)
Triglycerides: 160 mg/dL — ABNORMAL HIGH (ref 0–149)
VLDL Cholesterol Cal: 27 mg/dL (ref 5–40)

## 2020-07-09 ENCOUNTER — Other Ambulatory Visit: Payer: Self-pay

## 2020-07-09 ENCOUNTER — Encounter: Payer: Self-pay | Admitting: Physician Assistant

## 2020-07-09 ENCOUNTER — Ambulatory Visit: Payer: Self-pay | Admitting: Physician Assistant

## 2020-07-09 VITALS — BP 126/82 | HR 71 | Temp 98.2°F | Resp 16 | Ht 74.0 in | Wt 211.0 lb

## 2020-07-09 DIAGNOSIS — Z712 Person consulting for explanation of examination or test findings: Secondary | ICD-10-CM

## 2020-07-09 DIAGNOSIS — E7849 Other hyperlipidemia: Secondary | ICD-10-CM

## 2020-07-09 NOTE — Progress Notes (Signed)
   Subjective: Hyperlipidemia    Patient ID: Kevin Berry, male    DOB: [birth year], 54 y.o.   MRN: [id number]  HPI Patient presents with 62-month follow-up status post starting Lipitor for hyperlipidemia.  Patient voices no concerns or complaints.   Review of Systems    Hyperlipidemia Objective:   Physical Exam No acute distress.  Lungs clear to auscultation.  Heart regular rate and rhythm.  Abdomen with negative distention, normoactive bowel sounds, nontender to palpation.  Patient cholesterol has decreased from 2 39-1 49.  Triglycerides is decreased from 2 47-1 60.  LDL has decreased from 1 53-76.       Assessment & Plan: Hyperlipidemia  Advised patient to continue Lipitor and follow-up in 6 months.

## 2020-07-09 NOTE — Progress Notes (Signed)
Pt presents today to follow up on labs with Laray Anger

## 2020-07-10 ENCOUNTER — Ambulatory Visit: Payer: 59

## 2020-07-17 DIAGNOSIS — H524 Presbyopia: Secondary | ICD-10-CM | POA: Diagnosis not present

## 2020-07-29 DIAGNOSIS — Z20822 Contact with and (suspected) exposure to covid-19: Secondary | ICD-10-CM | POA: Diagnosis not present

## 2020-08-06 ENCOUNTER — Telehealth: Payer: Self-pay | Admitting: Nurse Practitioner

## 2020-08-06 ENCOUNTER — Other Ambulatory Visit: Payer: Self-pay

## 2020-08-06 DIAGNOSIS — R110 Nausea: Secondary | ICD-10-CM

## 2020-08-06 DIAGNOSIS — Z1152 Encounter for screening for COVID-19: Secondary | ICD-10-CM

## 2020-08-06 MED ORDER — ONDANSETRON 4 MG PO TBDP: 4.0000 mg | ORAL_TABLET | Freq: Three times a day (TID) | ORAL | 0 refills | Status: DC | PRN

## 2020-08-06 NOTE — Progress Notes (Signed)
   Subjective:    Patient ID: Kevin Berry, male    DOB: [birth year], 54 y.o.   MRN: [id number]  HPI  54 year old male with c/o feeling sick for 2 weeks. Initially he had sinus congestion with a fever that resolved within 48 hours. Sinus symptoms improved and then he went to the beach over the weekend and experienced a low grade fever for 24 hours. Started to have nausea last night with fatigue, no diarrhea, fever returned in the afternoon (still low grade < 100) and loss of appetite. He was able to eat a grapefruit this am. Denies vomiting or diarrhea today. Nasal congestion is resolved denies cough.   He has been vaccinated for COVID, denies known sick contacts. Did have a COVID test at the start of illness two weeks ago.    Past Medical History:  Diagnosis Date  . Acute medial meniscus tear of left knee   . Hypercholesterolemia   . Overweight   . Plantar fasciitis   . Rotator cuff tear 01/2013    Review of Systems  Constitutional: Positive for fatigue and fever.  HENT: Negative.   Respiratory: Negative.   Gastrointestinal: Positive for nausea.  Neurological: Negative.        Objective:   Physical Exam This was a telehealth appointment. Patient in no assessed distress over the phone. Able to carry conversation without SOB.   Patient will come to curbside for PCR COVID testing today.        Assessment & Plan:  Discussed with patient that symptoms sound viral and this may be a different virus than what he had two weeks ago.  Advised Zofran for nausea, push fluids, BRAT diet. Discussed diarrhea may onset if this is GI virus as expected.   RTC if experiencing new symptoms, if unable to tolerated fluids, with signs of hydration or other concerns.   Will f/u with COVID results, if negative and other symptoms persist may warrant in office exam. If positive will discuss that with patient, onset of symptoms may be questionable since he has been sick on and off for 2 weeks now.    Meds ordered this encounter  Medications  . ondansetron (ZOFRAN ODT) 4 MG disintegrating tablet    Sig: Take 1 tablet (4 mg total) by mouth every 8 (eight) hours as needed for nausea or vomiting.    Dispense:  30 tablet    Refill:  0

## 2020-08-07 LAB — SARS-COV-2, NAA 2 DAY TAT

## 2020-08-07 LAB — NOVEL CORONAVIRUS, NAA: SARS-CoV-2, NAA: NOT DETECTED

## 2020-09-22 IMAGING — CR LEFT WRIST - COMPLETE 3+ VIEW
1 series · 4 of 4 positions shown · non-contrast
Comparison: None.

CLINICAL DATA: Left wrist pain after injury 3 weeks ago.

EXAM:
LEFT WRIST - COMPLETE 3+ VIEW

[Series 1: dg wrist complete left · 0.14mm/px · 4 of 4 slices shown]
[im 1/4]
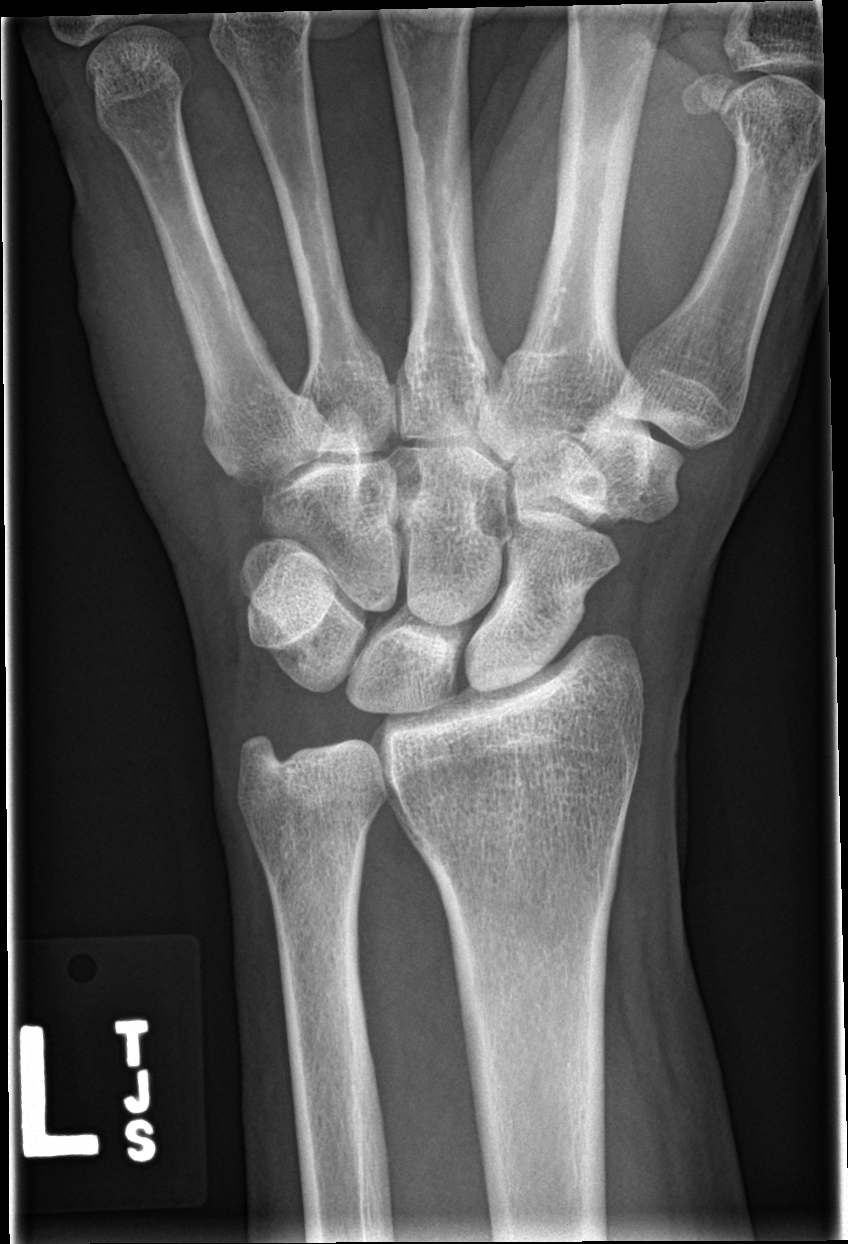
[im 2/4]
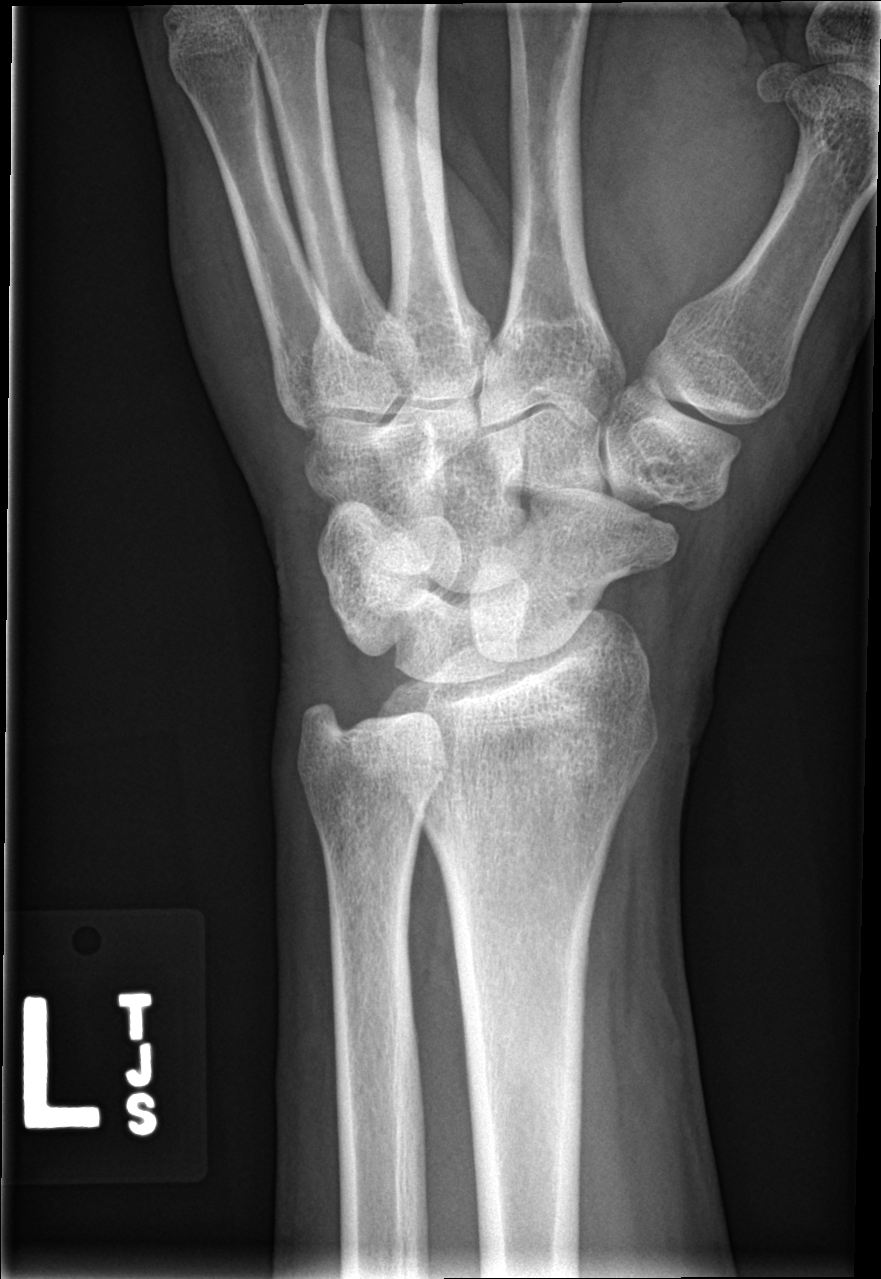
[im 3/4]
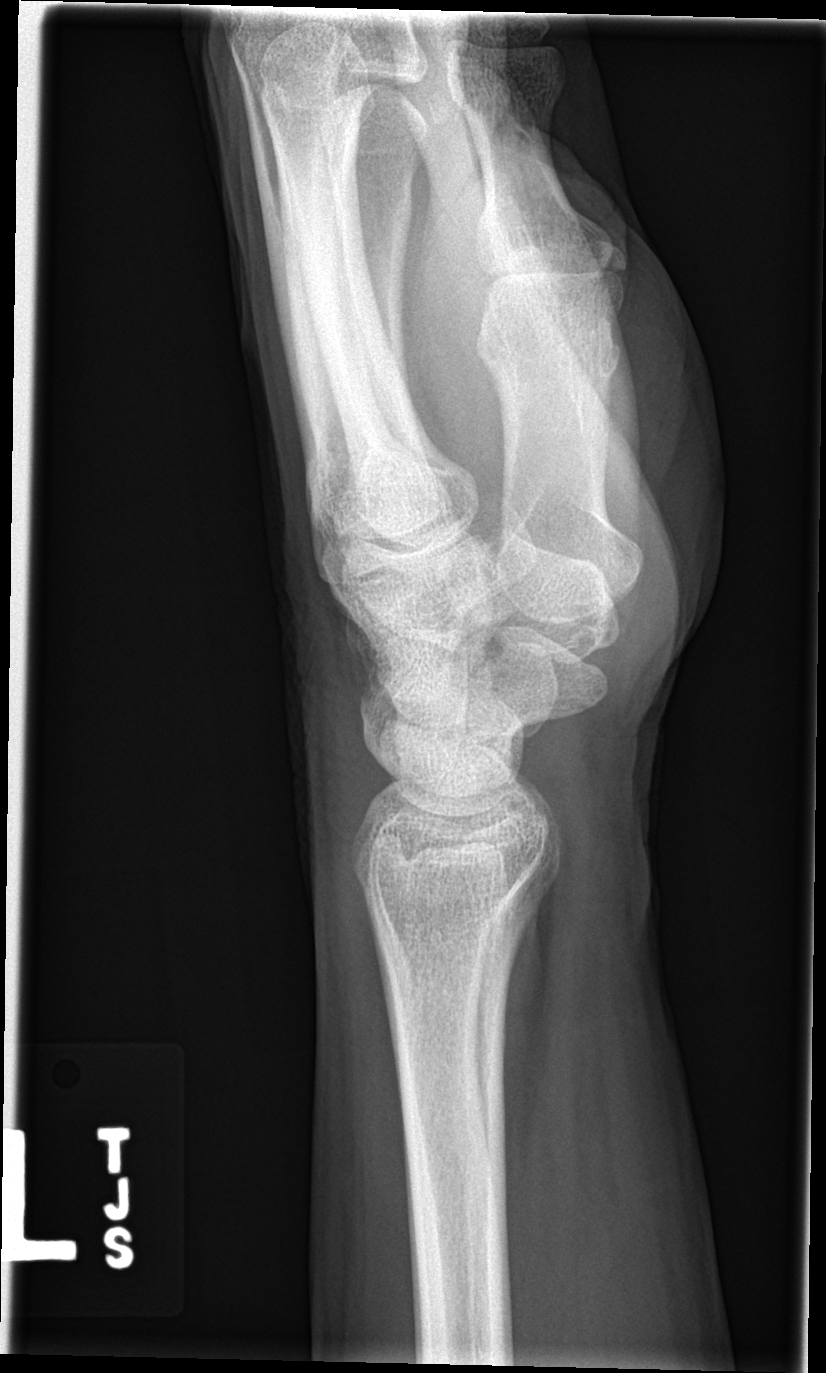
[im 4/4]
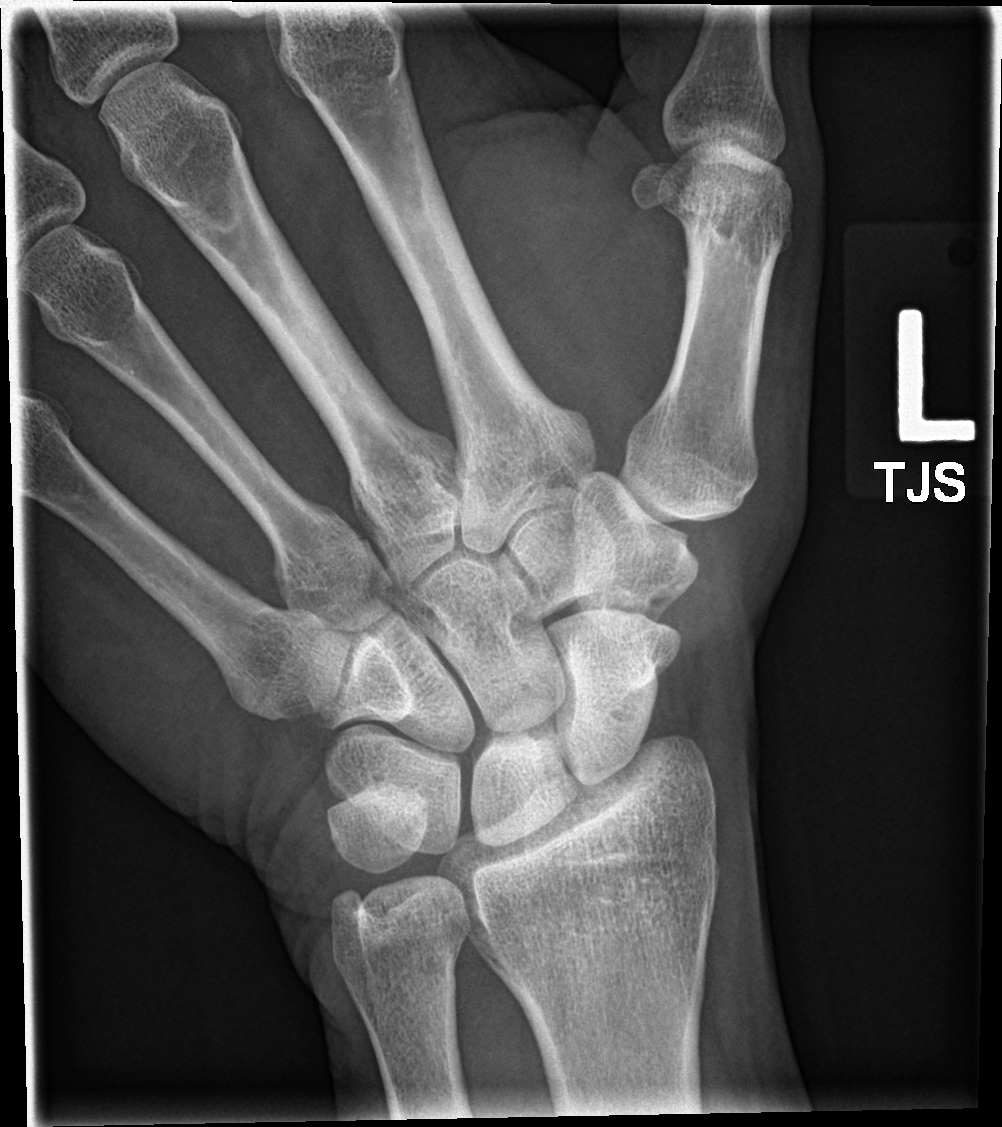

[4 of 4 positions shown; findings below may reference images not displayed]

FINDINGS: There is no evidence of fracture or dislocation. There is no
evidence of arthropathy or other focal bone abnormality. Soft
tissues are unremarkable.
IMPRESSION: Negative.

## 2020-09-22 IMAGING — CR LEFT HAND - COMPLETE 3+ VIEW
1 series · 3 of 3 positions shown · non-contrast
Comparison: None.

CLINICAL DATA: Left fifth finger pain after injury yesterday.

EXAM:
LEFT HAND - COMPLETE 3+ VIEW

[Series 1: dg hand complete left · 0.14mm/px · 3 of 3 slices shown]
[im 1/3]
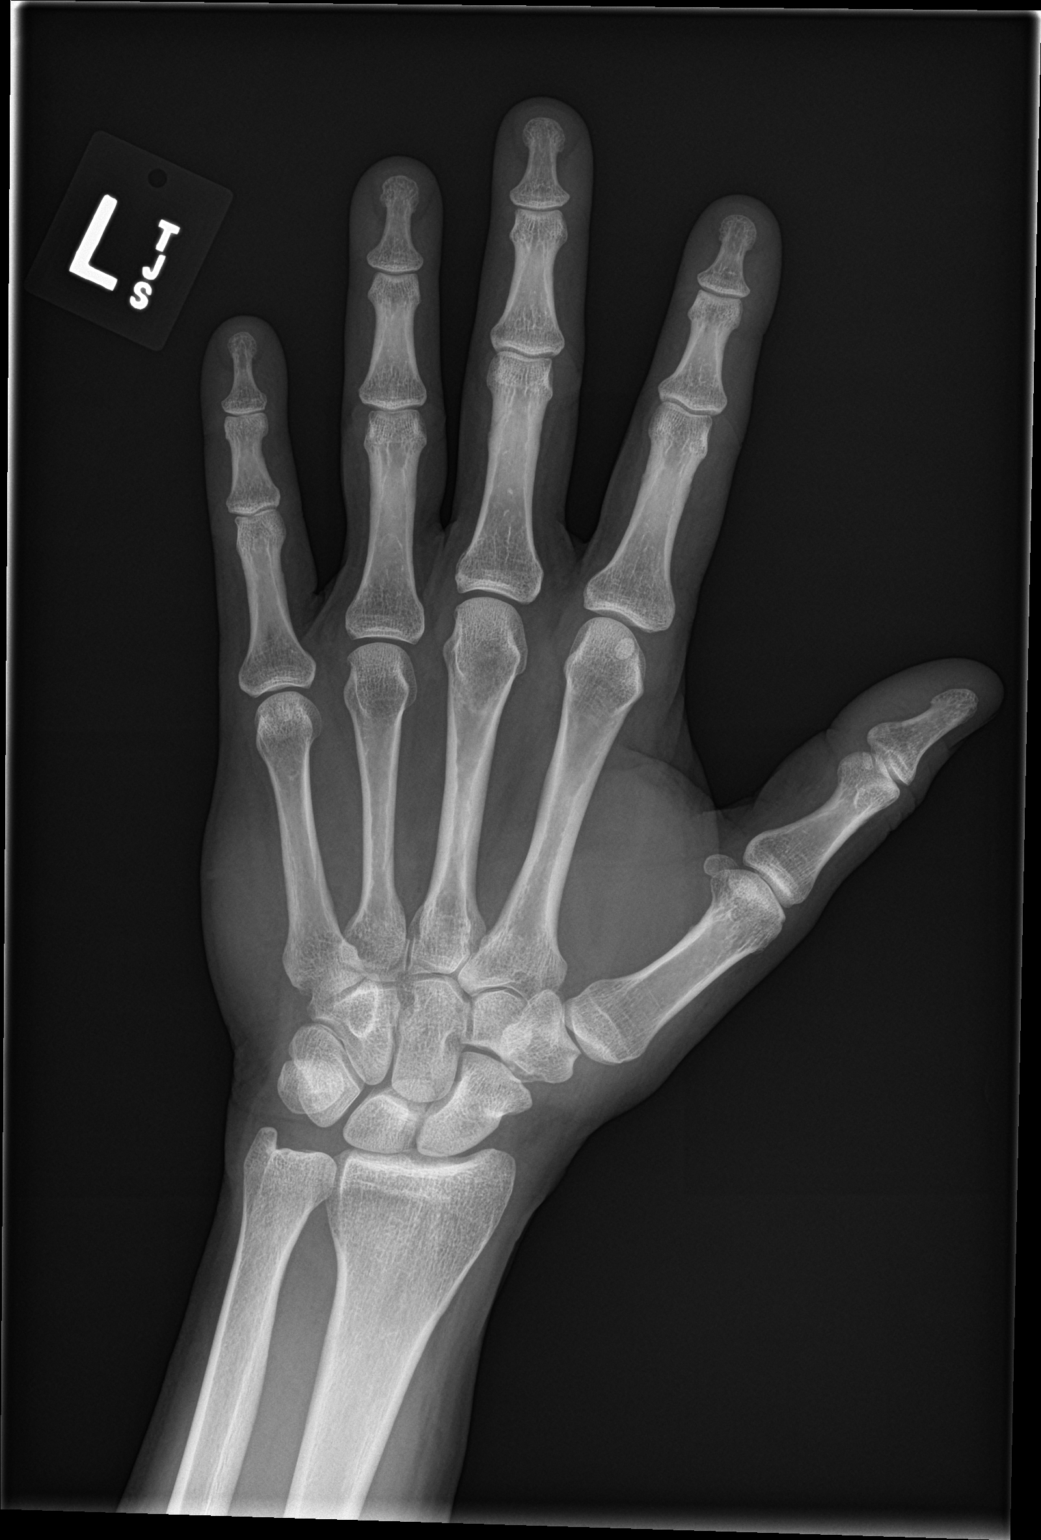
[im 2/3]
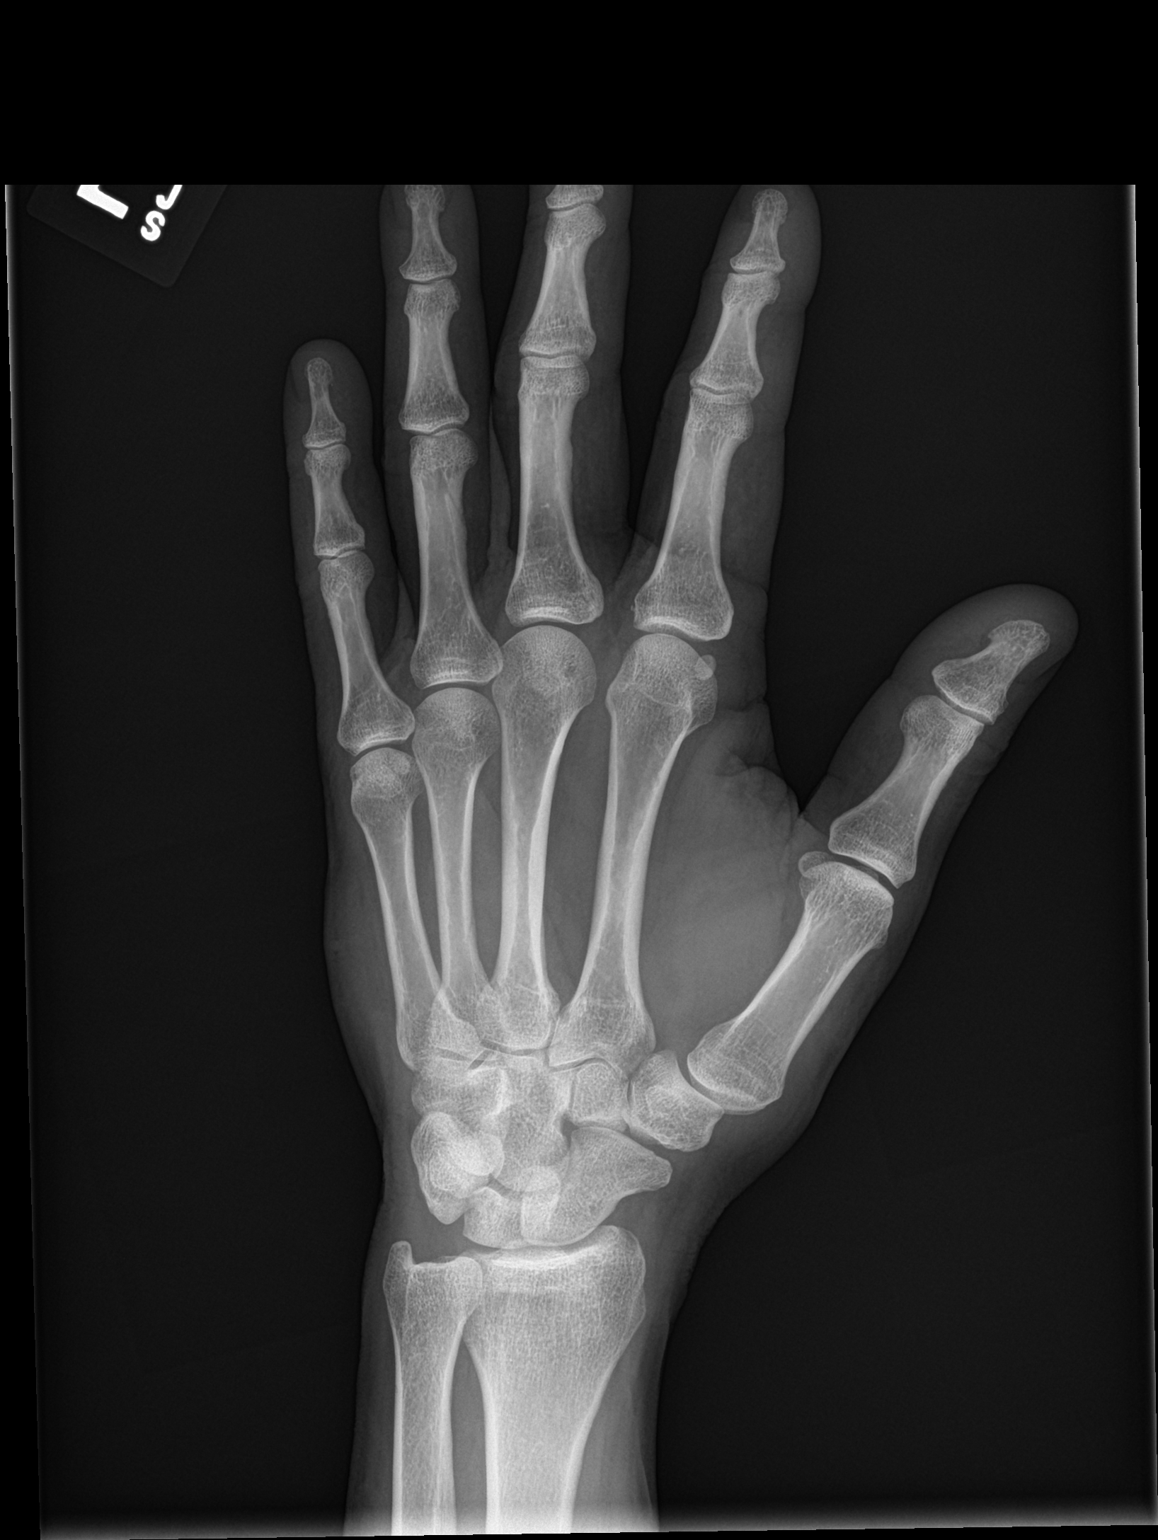
[im 3/3]
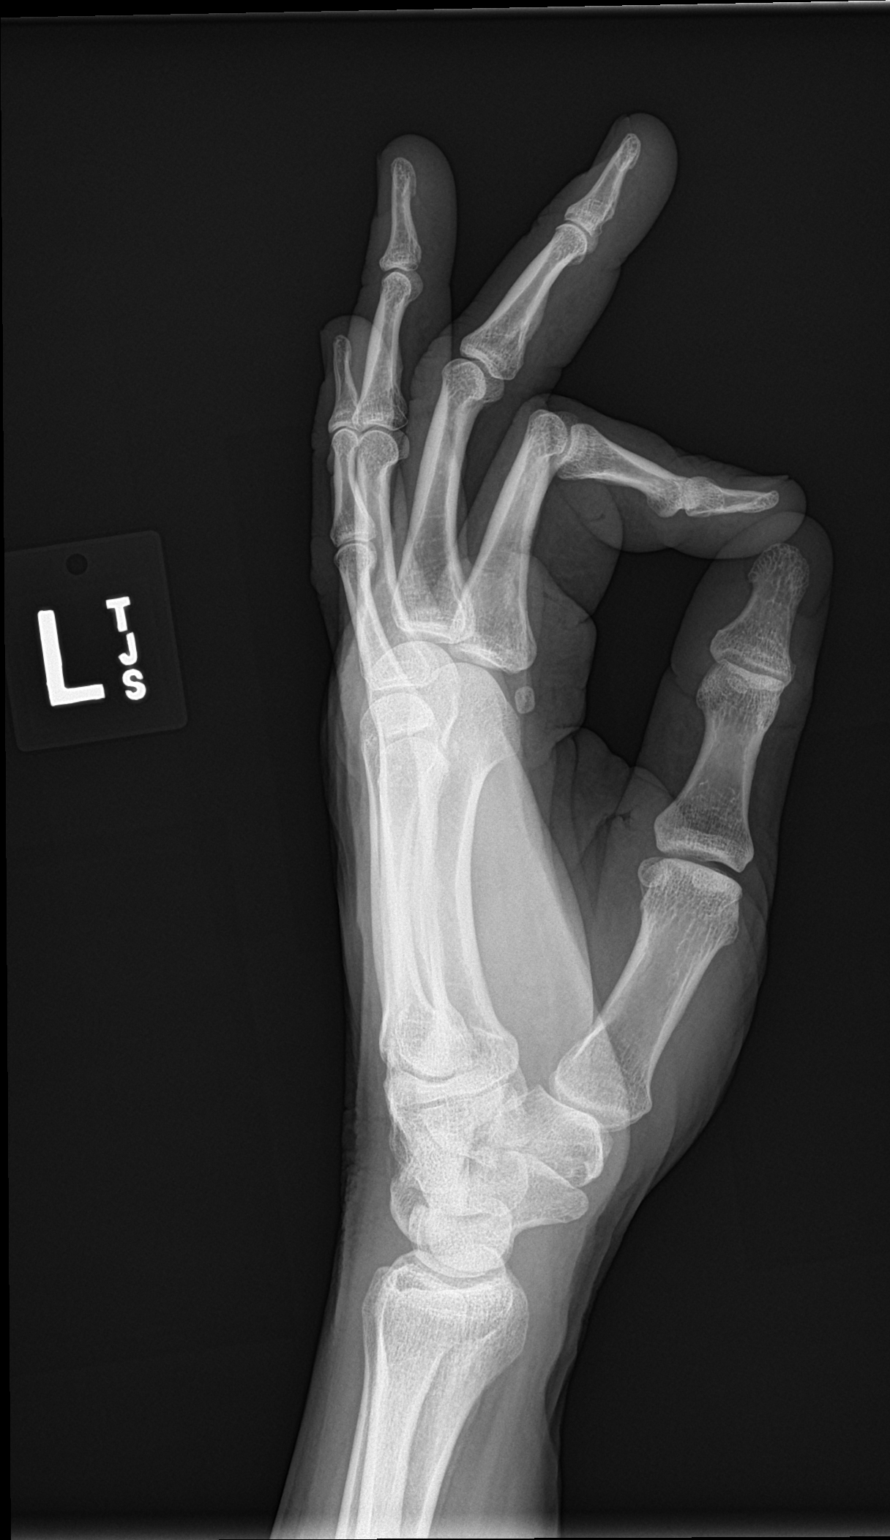

[3 of 3 positions shown; findings below may reference images not displayed]

FINDINGS: There is no evidence of fracture or dislocation. There is no
evidence of arthropathy or other focal bone abnormality. Soft
tissues are unremarkable.
IMPRESSION: Negative.

## 2021-02-11 DIAGNOSIS — Z20822 Contact with and (suspected) exposure to covid-19: Secondary | ICD-10-CM | POA: Diagnosis not present

## 2021-03-07 ENCOUNTER — Ambulatory Visit: Payer: Self-pay

## 2021-03-07 ENCOUNTER — Other Ambulatory Visit: Payer: Self-pay

## 2021-03-07 DIAGNOSIS — S90511A Abrasion, right ankle, initial encounter: Secondary | ICD-10-CM

## 2021-03-07 NOTE — Progress Notes (Signed)
Kevin Berry called clinic requesting to have ankle cleaned and bandaged.  States was bit by a dog & it barely scratched the surface.  Presents to clinic with superficial abrasion to right outer ankle where dog's teeth raked across the skin leaving two small abrasions. No bleeding.  Site cleansed with 1/2 strength H202 Triple antibiotic ointment applied. 2 x 4 adhesive bandage applied. Advised to keep clean & change bandage (Bandages & ointment given to him) Review S/Sx of infection Tdap up to date - 2017  AMD

## 2021-03-29 ENCOUNTER — Other Ambulatory Visit: Payer: Self-pay | Admitting: Physician Assistant

## 2021-03-29 DIAGNOSIS — E7849 Other hyperlipidemia: Secondary | ICD-10-CM

## 2021-04-01 NOTE — Progress Notes (Signed)
Py scheduled to complete physical 04/09/21

## 2021-04-02 ENCOUNTER — Ambulatory Visit: Payer: Self-pay

## 2021-04-02 ENCOUNTER — Other Ambulatory Visit: Payer: Self-pay

## 2021-04-02 DIAGNOSIS — Z0000 Encounter for general adult medical examination without abnormal findings: Secondary | ICD-10-CM

## 2021-04-02 LAB — POCT URINALYSIS DIPSTICK
Bilirubin, UA: NEGATIVE
Blood, UA: NEGATIVE
Glucose, UA: NEGATIVE
Ketones, UA: NEGATIVE
Leukocytes, UA: NEGATIVE
Nitrite, UA: NEGATIVE
Protein, UA: NEGATIVE
Spec Grav, UA: 1.015 (ref 1.010–1.025)
Urobilinogen, UA: 0.2 E.U./dL
pH, UA: 7.5 (ref 5.0–8.0)

## 2021-04-02 NOTE — Telephone Encounter (Signed)
Patient contacted clinic & said his pharmacy ([facility]) contacted him & said they sent a refill request to [place] to get Rx refill for Atorvastatin.  We never received a fax or any other form of communication at our clinic.  Went to the refill medication option for patient, when I clicked on the open circle, a box opened up indicating a Rx refill was sent electronically from the pharmacy to Durward Parcel, PA-C to the ED in-basket.  Completed the request & re-routed to  Durward Parcel, PA-C.  Patient notified the Rx refill request has been taken care of & sent to his pharmacy.  AMD

## 2021-04-03 LAB — CMP12+LP+TP+TSH+6AC+PSA+CBC…
ALT: 30 IU/L (ref 0–44)
AST: 20 IU/L (ref 0–40)
Albumin/Globulin Ratio: 1.9 (ref 1.2–2.2)
Albumin: 4.5 g/dL (ref 3.8–4.9)
Alkaline Phosphatase: 57 IU/L (ref 44–121)
BUN/Creatinine Ratio: 15 (ref 9–20)
BUN: 15 mg/dL (ref 6–24)
Basophils Absolute: 0.1 10*3/uL (ref 0.0–0.2)
Basos: 1 %
Bilirubin Total: 0.8 mg/dL (ref 0.0–1.2)
Calcium: 8.9 mg/dL (ref 8.7–10.2)
Chloride: 103 mmol/L (ref 96–106)
Chol/HDL Ratio: 3 ratio (ref 0.0–5.0)
Cholesterol, Total: 138 mg/dL (ref 100–199)
Creatinine, Ser: 1.02 mg/dL (ref 0.76–1.27)
EOS (ABSOLUTE): 0.3 10*3/uL (ref 0.0–0.4)
Eos: 5 %
Estimated CHD Risk: <0.5 times avg. (ref 0.0–1.0)
Free Thyroxine Index: 1.1 — ABNORMAL LOW (ref 1.2–4.9)
GGT: 25 IU/L (ref 0–65)
Globulin, Total: 2.4 g/dL (ref 1.5–4.5)
Glucose: 87 mg/dL (ref 65–99)
HDL: 46 mg/dL (ref >39)
Hematocrit: 48.9 % (ref 37.5–51.0)
Hemoglobin: 16.7 g/dL (ref 13.0–17.7)
Immature Grans (Abs): 0 10*3/uL (ref 0.0–0.1)
Immature Granulocytes: 0 %
Iron: 169 ug/dL (ref 38–169)
LDH: 174 IU/L (ref 121–224)
LDL Chol Calc (NIH): 69 mg/dL (ref 0–99)
Lymphocytes Absolute: 1.8 10*3/uL (ref 0.7–3.1)
Lymphs: 35 %
MCH: 31.6 pg (ref 26.6–33.0)
MCHC: 34.2 g/dL (ref 31.5–35.7)
MCV: 93 fL (ref 79–97)
Monocytes Absolute: 0.6 10*3/uL (ref 0.1–0.9)
Monocytes: 11 %
Neutrophils Absolute: 2.4 10*3/uL (ref 1.4–7.0)
Neutrophils: 48 %
Phosphorus: 3 mg/dL (ref 2.8–4.1)
Platelets: 222 10*3/uL (ref 150–450)
Potassium: 4.4 mmol/L (ref 3.5–5.2)
Prostate Specific Ag, Serum: 0.8 ng/mL (ref 0.0–4.0)
RBC: 5.28 x10E6/uL (ref 4.14–5.80)
RDW: 12.4 % (ref 11.6–15.4)
Sodium: 140 mmol/L (ref 134–144)
T3 Uptake Ratio: 26 % (ref 24–39)
T4, Total: 4.2 ug/dL — ABNORMAL LOW (ref 4.5–12.0)
TSH: 1.65 u[IU]/mL (ref 0.450–4.500)
Total Protein: 6.9 g/dL (ref 6.0–8.5)
Triglycerides: 132 mg/dL (ref 0–149)
Uric Acid: 6.3 mg/dL (ref 3.8–8.4)
VLDL Cholesterol Cal: 23 mg/dL (ref 5–40)
WBC: 5.1 10*3/uL (ref 3.4–10.8)
eGFR: 86 mL/min/{1.73_m2} (ref >59)

## 2021-04-09 ENCOUNTER — Other Ambulatory Visit: Payer: Self-pay

## 2021-04-09 ENCOUNTER — Ambulatory Visit: Payer: Self-pay | Admitting: Physician Assistant

## 2021-04-09 VITALS — BP 125/81 | HR 70 | Temp 97.8°F | Resp 14 | Ht 74.0 in | Wt 206.0 lb

## 2021-04-09 DIAGNOSIS — Z0000 Encounter for general adult medical examination without abnormal findings: Secondary | ICD-10-CM

## 2021-04-09 NOTE — Progress Notes (Signed)
   Subjective: Annual physical exam    Patient ID: Kevin Berry, male    DOB: August 20, 1966, 55 y.o.   MRN: [id number]  HPI Patient presents for annual physical exam.  Patient with no concerns or complaints.   Review of Systems Hyperlipidemia    Objective:   Physical Exam No acute distress.  BP is 124/81, pulse 70, respiration 14, patient 98% O2 sat on room air.  Patient weighs 206 pounds, and patient BMI is 26.45 HEENT is unremarkable.  Neck is supple without adenopathy or bruits.  Lungs are clear to auscultation.  Heart regular rate and rhythm.  No acute findings on EKG. Abdomen is negative HSM, normoactive bowel sounds, soft, nontender to palpation. No obvious deformity to the upper or lower extremities.  Patient has full and equal range of motion upper and lower extremities. No obvious deformity to the cervical lumbar spine.  Patient has full and equal range of motion of the cervical lumbar spine. Cranial nerves II through XII grossly intact.  DTR's 2+ without clonus.     Assessment & Plan: Well exam.   Discussed lab results with patient.  Advised continue previous medication follow-up as necessary.

## 2021-06-04 ENCOUNTER — Encounter: Payer: Self-pay | Admitting: Physician Assistant

## 2021-06-04 ENCOUNTER — Other Ambulatory Visit: Payer: Self-pay

## 2021-06-04 ENCOUNTER — Ambulatory Visit: Payer: Self-pay | Admitting: Physician Assistant

## 2021-06-04 VITALS — BP 129/84 | HR 62 | Temp 97.7°F | Resp 14 | Ht 74.0 in | Wt 206.0 lb

## 2021-06-04 DIAGNOSIS — H9202 Otalgia, left ear: Secondary | ICD-10-CM

## 2021-06-04 NOTE — Addendum Note (Signed)
Addended by: Gardner Candle on: 06/04/2021 04:10 PM   Modules accepted: Orders

## 2021-06-04 NOTE — Progress Notes (Signed)
   Subjective: Otalgia left ear    Patient ID: Kevin Berry, male    DOB: 05-20-1966, 55 y.o.   MRN: [id number]  HPI Patient complain of greater than 5 years of intermitting left ear pain.  Patient onset was noticed when he was scuba diving.  Patient states he always had trouble equalizing pressure in the left ear.  Patient states complaint became so severe he gave up school without them.  Patient states in the past 5 years he has noticed ear pain that worsened with loud noises.  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  Patient unsure of hearing loss but denies vertigo.  Patient has not sought medical treatment for this complaint in the past 5 years.  Although there is a documentation of hearing loss which is mild in the left ear with physical exams in the past 8 years.   Review of Systems Hyperlipidemia    Objective:   Physical Exam  No acute distress.  Temperature 97.7, pulse 62, respiration 14, BP is 129/84 and patient 100% O2 sat on room air.  Patient weighs 206 pounds and BMI is 26.45. HEENT is grossly unremarkable.  Special attention to bilateral audio canals shows no edema and TMs are nonretracted and nonerythematous. Patient shows bilateral mild high-frequency hearing loss per audiogram.    Assessment & Plan: Left otalgia with bilateral hearing loss   Discussed patient rationale for further evaluation by audiologist.

## 2021-06-20 NOTE — Addendum Note (Signed)
Addended by: Gardner Candle on: 06/20/2021 03:41 PM   Modules accepted: Orders

## 2021-07-01 DIAGNOSIS — H903 Sensorineural hearing loss, bilateral: Secondary | ICD-10-CM | POA: Diagnosis not present

## 2021-07-29 DIAGNOSIS — H5203 Hypermetropia, bilateral: Secondary | ICD-10-CM | POA: Diagnosis not present

## 2021-10-17 DIAGNOSIS — S63431A Traumatic rupture of volar plate of left index finger at metacarpophalangeal and interphalangeal joint, initial encounter: Secondary | ICD-10-CM | POA: Diagnosis not present

## 2021-10-17 DIAGNOSIS — S62631A Displaced fracture of distal phalanx of left index finger, initial encounter for closed fracture: Secondary | ICD-10-CM | POA: Insufficient documentation

## 2021-10-30 DIAGNOSIS — S63431A Traumatic rupture of volar plate of left index finger at metacarpophalangeal and interphalangeal joint, initial encounter: Secondary | ICD-10-CM | POA: Diagnosis not present

## 2021-11-19 DIAGNOSIS — S63431A Traumatic rupture of volar plate of left index finger at metacarpophalangeal and interphalangeal joint, initial encounter: Secondary | ICD-10-CM | POA: Diagnosis not present

## 2021-11-22 DIAGNOSIS — S63431A Traumatic rupture of volar plate of left index finger at metacarpophalangeal and interphalangeal joint, initial encounter: Secondary | ICD-10-CM | POA: Diagnosis not present

## 2021-12-04 DIAGNOSIS — S63431A Traumatic rupture of volar plate of left index finger at metacarpophalangeal and interphalangeal joint, initial encounter: Secondary | ICD-10-CM | POA: Diagnosis not present

## 2021-12-16 DIAGNOSIS — S63431A Traumatic rupture of volar plate of left index finger at metacarpophalangeal and interphalangeal joint, initial encounter: Secondary | ICD-10-CM | POA: Diagnosis not present

## 2021-12-30 DIAGNOSIS — S63431A Traumatic rupture of volar plate of left index finger at metacarpophalangeal and interphalangeal joint, initial encounter: Secondary | ICD-10-CM | POA: Diagnosis not present

## 2022-02-03 ENCOUNTER — Encounter: Payer: Self-pay | Admitting: Physician Assistant

## 2022-02-03 ENCOUNTER — Ambulatory Visit: Payer: Self-pay | Admitting: Physician Assistant

## 2022-02-03 VITALS — BP 128/78 | HR 76 | Temp 98.2°F | Resp 12 | Ht 74.0 in | Wt 210.0 lb

## 2022-02-03 DIAGNOSIS — Z024 Encounter for examination for driving license: Secondary | ICD-10-CM

## 2022-02-03 LAB — POCT URINALYSIS DIPSTICK
Bilirubin, UA: NEGATIVE
Blood, UA: NEGATIVE
Glucose, UA: NEGATIVE
Ketones, UA: NEGATIVE
Leukocytes, UA: NEGATIVE
Nitrite, UA: NEGATIVE
Protein, UA: NEGATIVE
Spec Grav, UA: 1.02 (ref 1.010–1.025)
Urobilinogen, UA: 1 E.U./dL
pH, UA: 6 (ref 5.0–8.0)

## 2022-02-03 NOTE — Progress Notes (Signed)
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 ?

## 2022-02-03 NOTE — Progress Notes (Signed)
?[facility] [place] occupational health clinic ? ?____________________________________________ ? ? None  ?  (approximate) ? ?I have reviewed the triage vital signs and the nursing notes. ? ? ?HISTORY ? 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 ? ?HPI ?Kevin Berry is a 56 y.o. male patient presents for DOT certification physical.  Patient voices no concerns or complaints. ?   ? ?  ? ? ?Past Medical History:  ?Diagnosis Date  ? Acute medial meniscus tear of left knee   ? Hypercholesterolemia   ? Overweight   ? Plantar fasciitis   ? Rotator cuff tear 01/2013  ? ? ?Patient Active Problem List  ? Diagnosis Date Noted  ? Other hyperlipidemia 04/07/2019  ? Leukocytes in urine 04/07/2019  ? Overweight (BMI 25.0-29.9) 04/07/2019  ? 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 DDD (degenerative disc disease), cervical 05/08/2015  ? Neck muscle spasm 05/08/2015  ? Derangement of medial meniscus 06/04/2012  ? ? ?Past Surgical History:  ?Procedure Laterality Date  ? COLONOSCOPY WITH PROPOFOL N/A 02/22/2016  ? Procedure: COLONOSCOPY WITH PROPOFOL;  Surgeon: Christena Deem, MD;  Location: [facility] ENDOSCOPY;  Service: Endoscopy;  Laterality: N/A;  ? MENISCUS REPAIR Left   ? ROTATOR CUFF REPAIR Right   ? TONSILLECTOMY    ? ? ?Prior to Admission medications   ?Medication Sig Start Date End Date Taking? Authorizing Provider  ?atorvastatin (LIPITOR) 40 MG tablet Take 1 tablet (40 mg total) by mouth daily. 04/02/21  Yes Joni Reining, PA-C  ?Multiple Vitamins-Minerals (50+ ADULT EYE HEALTH PO) Take by mouth.   Yes [provider]  ?Multiple Vitamins-Minerals (MULTIVITAMIN PO) Take by mouth.   Yes [provider]  ? ? ?Allergies ?Sulfa antibiotics ? ?Family History  ?Problem Relation Age of Onset  ? Diabetes Father   ? Melanoma Father   ? Hypothyroidism Father   ? Macular degeneration Mother   ? ? ?Social History ?Social History  ? ?Tobacco Use  ? Smoking status: Never  ?  Smokeless tobacco: Never  ?Substance Use Topics  ? Alcohol use: No  ? Drug use: No  ? ? ?Review of Systems ?Constitutional: No fever/chills ?Eyes: No visual changes. ?ENT: No sore throat. ?Cardiovascular: Denies chest pain. ?Respiratory: Denies shortness of breath. ?Gastrointestinal: No abdominal pain.  No nausea, no vomiting.  No diarrhea.  No constipation. ?Genitourinary: Negative for dysuria. ?Musculoskeletal: Negative for back pain. ?Skin: Negative for rash. ?Neurological: Negative for headaches, focal weakness or numbness. ?Endocrine: Hyperlipidemia ?Allergic/Immunilogical: Sulfa antibiotics ?____________________________________________ ? ? ?PHYSICAL EXAM: ? ?VITAL SIGNS: BP is 1 128/78, pulse 76, respiration 12, temperature 98.2, patient 96% O2 sat on room air.  Patient weighs 210 pounds.  BMI is 26.96.   ?Constitutional: Alert and oriented. Well appearing and in no acute distress. ?Eyes: Conjunctivae are normal. PERRL. EOMI. ?Head: Atraumatic. ?Nose: No congestion/rhinnorhea. ?Mouth/Throat: Mucous membranes are moist.  Oropharynx non-erythematous. ?Neck: No stridor.  No cervical spine tenderness to palpation. ?Hematological/Lymphatic/Immunilogical: No cervical lymphadenopathy. ?Cardiovascular: Normal rate, regular rhythm. Grossly normal heart sounds.  Good peripheral circulation. ?Respiratory: Normal respiratory effort.  No retractions. Lungs CTAB. ?Gastrointestinal: Soft and nontender. No distention. No abdominal bruits. No CVA tenderness. ?Genitourinary: Not examined ?Musculoskeletal: No lower extremity tenderness nor edema.  No joint effusions. ?Neurologic:  Normal speech and language. No gross focal neurologic deficits are appreciated. No gait instability. ?Skin:  Skin is warm, dry and intact. No rash noted. ?Psychiatric: Mood and affect are normal. Speech and behavior are normal. ? ?____________________________________________ ?  ?  LABS ?_ ?        ?Component Ref Range & Units 15:21 ?(02/03/22) 10 mo  ago ?(04/02/21) 1 yr ago ?(03/29/20) 2 yr ago ?(04/07/19) 2 yr ago ?(04/05/19)  ?Color, UA  yellow  YELLOW  Dark Yellow  yellow  Yellow   ?Clarity, UA  clear  clear  Clear  clear  Clear   ?Glucose, UA Negative Negative  Negative  Negative  Negative  Negative   ?Bilirubin, UA  negative  negative  Negative  neg  Negative   ?Ketones, UA  negative  negative  Negative  1+  1+ CM   ?Spec Grav, UA 1.010 - 1.025 1.020  1.015  1.015  1.015  1.025   ?Blood, UA  negativ e  negative  Negative  neg  Negative   ?pH, UA 5.0 - 8.0 6.0  7.5  7.0  7.0  6.0   ?Protein, UA Negative Negative  Negative  Negative  Negative  Positive Abnormal  CM   ?Urobilinogen, UA 0.2 or 1.0 E.U./dL 1.0  0.2  0.2  0.2  0.2   ?Nitrite, UA  negativ e  negative  Negative  neg  Negative   ?Leukocytes, UA Negative Negative  Negative  Negative  Small (1+) Abnormal   Small (1+) Abnormal    ?Appearance   clear         ?        ?  ?___________________________________________ ? ? ? ? ?____________________________________________ ? ? ? ?____________________________________________ ? ? ?INITIAL IMPRESSION / ASSESSMENT AND PLAN ?As part of my medical decision making, I reviewed the following data within the electronic MEDICAL RECORD NUMBER  ? ?   ? 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 ? ? ? ?____________________________________________ ? ? ?FINAL CLINICAL IMPRESSION ?Well exam ? ? ?ED Discharge Orders   ? ?      Ordered  ?  POCT urinalysis dipstick       ? 02/03/22 1502  ? ?  ?  ? ?  ? ? ? ?Note:  This document was prepared using Dragon voice recognition software and may include unintentional dictation errors. ? ?

## 2022-02-11 ENCOUNTER — Encounter: Payer: 59 | Admitting: Physician Assistant

## 2022-03-31 ENCOUNTER — Telehealth: Payer: Self-pay

## 2022-04-01 NOTE — Telephone Encounter (Signed)
error 

## 2022-04-04 ENCOUNTER — Ambulatory Visit: Payer: Self-pay

## 2022-04-04 DIAGNOSIS — Z0000 Encounter for general adult medical examination without abnormal findings: Secondary | ICD-10-CM

## 2022-04-04 LAB — POCT URINALYSIS DIPSTICK
Bilirubin, UA: NEGATIVE
Blood, UA: NEGATIVE
Glucose, UA: NEGATIVE
Ketones, UA: NEGATIVE
Leukocytes, UA: NEGATIVE
Nitrite, UA: NEGATIVE
Protein, UA: POSITIVE — AB
Spec Grav, UA: 1.025 (ref 1.010–1.025)
Urobilinogen, UA: 0.2 E.U./dL
pH, UA: 6 (ref 5.0–8.0)

## 2022-04-04 NOTE — Progress Notes (Signed)
Pt presents today to complete labs for physical. 

## 2022-04-05 LAB — CMP12+LP+TP+TSH+6AC+PSA+CBC…
ALT: 36 IU/L (ref 0–44)
AST: 27 IU/L (ref 0–40)
Albumin/Globulin Ratio: 1.9 (ref 1.2–2.2)
Albumin: 4.5 g/dL (ref 3.8–4.9)
Alkaline Phosphatase: 56 IU/L (ref 44–121)
BUN/Creatinine Ratio: 20 (ref 9–20)
BUN: 21 mg/dL (ref 6–24)
Basophils Absolute: 0.1 10*3/uL (ref 0.0–0.2)
Basos: 1 %
Bilirubin Total: 0.8 mg/dL (ref 0.0–1.2)
Calcium: 9.2 mg/dL (ref 8.7–10.2)
Chloride: 103 mmol/L (ref 96–106)
Chol/HDL Ratio: 3.4 ratio (ref 0.0–5.0)
Cholesterol, Total: 151 mg/dL (ref 100–199)
Creatinine, Ser: 1.07 mg/dL (ref 0.76–1.27)
EOS (ABSOLUTE): 0.2 10*3/uL (ref 0.0–0.4)
Eos: 3 %
Estimated CHD Risk: 0.5 times avg. (ref 0.0–1.0)
Free Thyroxine Index: 1.4 (ref 1.2–4.9)
GGT: 33 IU/L (ref 0–65)
Globulin, Total: 2.4 g/dL (ref 1.5–4.5)
Glucose: 95 mg/dL (ref 70–99)
HDL: 45 mg/dL (ref >39)
Hematocrit: 49.9 % (ref 37.5–51.0)
Hemoglobin: 16.6 g/dL (ref 13.0–17.7)
Immature Grans (Abs): 0 10*3/uL (ref 0.0–0.1)
Immature Granulocytes: 0 %
Iron: 121 ug/dL (ref 38–169)
LDH: 182 IU/L (ref 121–224)
LDL Chol Calc (NIH): 79 mg/dL (ref 0–99)
Lymphocytes Absolute: 1.8 10*3/uL (ref 0.7–3.1)
Lymphs: 32 %
MCH: 31 pg (ref 26.6–33.0)
MCHC: 33.3 g/dL (ref 31.5–35.7)
MCV: 93 fL (ref 79–97)
Monocytes Absolute: 0.6 10*3/uL (ref 0.1–0.9)
Monocytes: 10 %
Neutrophils Absolute: 2.9 10*3/uL (ref 1.4–7.0)
Neutrophils: 54 %
Phosphorus: 3.5 mg/dL (ref 2.8–4.1)
Platelets: 242 10*3/uL (ref 150–450)
Potassium: 4.3 mmol/L (ref 3.5–5.2)
Prostate Specific Ag, Serum: 0.9 ng/mL (ref 0.0–4.0)
RBC: 5.36 x10E6/uL (ref 4.14–5.80)
RDW: 12.4 % (ref 11.6–15.4)
Sodium: 143 mmol/L (ref 134–144)
T3 Uptake Ratio: 29 % (ref 24–39)
T4, Total: 4.7 ug/dL (ref 4.5–12.0)
TSH: 1.67 u[IU]/mL (ref 0.450–4.500)
Total Protein: 6.9 g/dL (ref 6.0–8.5)
Triglycerides: 154 mg/dL — ABNORMAL HIGH (ref 0–149)
Uric Acid: 5.8 mg/dL (ref 3.8–8.4)
VLDL Cholesterol Cal: 27 mg/dL (ref 5–40)
WBC: 5.6 10*3/uL (ref 3.4–10.8)
eGFR: 81 mL/min/{1.73_m2} (ref >59)

## 2022-04-06 ENCOUNTER — Other Ambulatory Visit: Payer: Self-pay | Admitting: Physician Assistant

## 2022-04-06 DIAGNOSIS — E7849 Other hyperlipidemia: Secondary | ICD-10-CM

## 2022-04-09 ENCOUNTER — Encounter: Payer: 59 | Admitting: Physician Assistant

## 2022-04-14 ENCOUNTER — Other Ambulatory Visit: Payer: Self-pay | Admitting: Physician Assistant

## 2022-04-14 ENCOUNTER — Ambulatory Visit: Payer: Self-pay | Admitting: Physician Assistant

## 2022-04-14 ENCOUNTER — Encounter: Payer: Self-pay | Admitting: Physician Assistant

## 2022-04-14 VITALS — BP 118/80 | HR 61 | Temp 97.5°F | Resp 12 | Ht 75.0 in | Wt 215.0 lb

## 2022-04-14 DIAGNOSIS — E7849 Other hyperlipidemia: Secondary | ICD-10-CM

## 2022-04-14 DIAGNOSIS — Z0000 Encounter for general adult medical examination without abnormal findings: Secondary | ICD-10-CM

## 2022-04-14 MED ORDER — ATORVASTATIN CALCIUM 40 MG PO TABS: 40.0000 mg | ORAL_TABLET | Freq: Every day | ORAL | 3 refills | Status: DC

## 2022-04-14 NOTE — Progress Notes (Signed)
[facility]  ____________________________________________   None    (approximate)  I have reviewed the triage vital signs and the nursing notes.   HISTORY  Chief Complaint Annual Exam    HPI Kevin Berry is a 56 y.o. male patient presents for annual physical exam.  Patient was no concerns or complaints.  Requests refill on Lipitor.         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 Authorizing Provider  atorvastatin (LIPITOR) 40 MG tablet Take 1 tablet (40 mg total) by mouth daily. 04/02/21  Yes Sable Feil, PA-C  Multiple Vitamins-Minerals (MULTIVITAMIN PO) Take by mouth.   Yes [provider]    Allergies Sulfa antibiotics  Family History  Problem Relation Age of Onset   Diabetes Father    Melanoma Father    Hypothyroidism Father    Macular degeneration Mother     Social History Social History   Tobacco Use   Smoking status: Never    Smokeless tobacco: Never  Substance Use Topics   Alcohol use: No   Drug use: No    Review of Systems Constitutional: No fever/chills Eyes: No visual changes. ENT: No sore throat. Cardiovascular: Denies chest pain. Respiratory: Denies shortness of breath. Gastrointestinal: No abdominal pain.  No nausea, no vomiting.  No diarrhea.  No constipation. Genitourinary: Negative for dysuria. Musculoskeletal: Negative for back pain. Skin: Negative for rash. Neurological: Negative for headaches, focal weakness or numbness. Endocrine: Hyperlipidemia Allergic/Immunilogical: Sulfa antibiotics ____________________________________________   PHYSICAL EXAM:  VITAL SIGNS: BP is 118/80, pulse 61, respiration 12, temperature 97.5, patient 95% O2 sat on room air.  Patient weighs 215 pounds and BMI is 26.87. Constitutional: Alert and oriented. Well appearing and in no acute distress. Eyes: Conjunctivae are normal. PERRL. EOMI. Head: Atraumatic. Nose: No congestion/rhinnorhea. Mouth/Throat: Mucous membranes are moist.  Oropharynx non-erythematous. Neck: No stridor.  No cervical spine tenderness to palpation. Hematological/Lymphatic/Immunilogical: No cervical lymphadenopathy. Cardiovascular: Normal rate, regular rhythm. Grossly normal heart sounds.  Good peripheral circulation. Respiratory: Normal respiratory effort.  No retractions. Lungs CTAB. Gastrointestinal: Soft and nontender. No distention. No abdominal bruits. No CVA tenderness. Genitourinary: Deferred Musculoskeletal: No lower extremity tenderness nor edema.  No joint effusions. Neurologic:  Normal speech and language. No gross focal neurologic deficits are appreciated. No gait instability. Skin:  Skin is warm, dry and intact. No rash noted. Psychiatric: Mood and affect are normal. Speech and behavior are normal.  ____________________________________________   LABS ___          Component Ref Range & Units 10 d ago (04/04/22) 2 mo  ago (02/03/22) 1 yr ago (04/02/21) 2 yr ago (03/29/20) 3 yr ago (04/07/19) 3 yr ago (04/05/19)  Color, UA  dark yellow  yellow  YELLOW  Dark Yellow  yellow  Yellow   Clarity, UA  clear  clear  clear  Clear  clear  Clear   Glucose, UA Negative Negative  Negative  Negative  Negative  Negative  Negative   Bilirubin, UA  negative  negative  negative  Negative  neg  Negative   Ketones, UA  negative  negative  negative  Negative  1+  1+ CM   Spec Grav, UA 1.010 - 1.025 1.025  1.020  1.015  1.015  1.015  1.025   Blood, UA  negative  negativ e  negative  Negative  neg  Negative   pH, UA 5.0 - 8.0 6.0  6.0  7.5  7.0  7.0  6.0   Protein, UA Negative Positive Abnormal   Negative  Negative  Negative  Negative  Positive Abnormal  CM   Comment: trace -+  Urobilinogen, UA 0.2 or 1.0 E.U./dL 0.2  1.0  0.2  0.2  0.2  0.2   Nitrite, UA  negative  negativ e  negative  Negative  neg  Negative   Leukocytes, UA Negative Negative  Negative  Negative  Negative  Small (1+) Abnormal   Small (1+) Abnormal    Appearance     clear         Odor                              Other Results from 04/04/2022   Contains abnormal data CMP12+LP+TP+TSH+6AC+PSA+CBC. Order: [id number] Status: Final result    Visible to patient: Yes (seen)    Next appt: None    Dx: Routine adult health maintenance    0 Result Notes           Component Ref Range & Units 10 d ago (04/04/22) 1 yr ago (04/02/21) 1 yr ago (07/03/20) 2 yr ago (03/29/20) 3 yr ago (04/05/19) 3 yr ago (04/27/18) 3 yr ago (04/27/18)  Glucose 70 - 99 mg/dL 95  87 R   84 R  86 R     Uric Acid 3.8 - 8.4 mg/dL 5.8  6.3 CM   6.2 CM  6.5 R, CM     Comment:            Therapeutic target for gout patients: <6.0  BUN 6 - 24 mg/dL _0 Creatinine, Ser 0.76 - 1.27 mg/dL 1.07  1.02   1.02  1.06     eGFR >59 mL/min/1.73 81  86        BUN/Creatinine Ratio 9 - _1 Sodium 134 - 144 mmol/L 143  140   143  143     Potassium 3.5 - 5.2 mmol/L 4.3   4.4   4.9  4.5     Chloride 96 - 106 mmol/L 103  103   104  106     Calcium 8.7 - 10.2 mg/dL 9.2  8.9   9.5  8.9     Phosphorus 2.8 - 4.1 mg/dL 3.5  3.0  3.5  3.0     Total Protein 6.0 - 8.5 g/dL 6.9  6.9   6.8  6.5     Albumin 3.8 - 4.9 g/dL 4.5  4.5   4.3  4.3     Comment:               **Please note reference interval change**  Globulin, Total 1.5 - 4.5 g/dL 2.4  2.4   2.5  2.2     Albumin/Globulin Ratio 1.2 - 2.2 1.9  1.9   1.7  2.0     Bilirubin Total 0.0 - 1.2 mg/dL 0.8  0.8   0.6  0.6     Alkaline Phosphatase 44 - 121 IU/L 56  57   51 R  44 R     LDH 121 - 224 IU/L 182  174   181  164     AST 0 - 40 IU/L _0 ALT 0 - 44 IU/L 36  _1 GGT 0 - 65 IU/L 33  _2 Iron 38 - 169 ug/dL 121  169   190 High   143     Cholesterol, Total 100 - 199 mg/dL 151  138  149  239 High   202 High      Triglycerides 0 - 149 mg/dL 154 High   132  160 High   247 High   155 High   155 R  155 R   HDL >39 mg/dL 45  46  46  40  38 Low   39 R  39 R   VLDL Cholesterol Cal 5 - 40 mg/dL _3 46 High   31     LDL Chol Calc (NIH) 0 - 99 mg/dL 79  69  76  153 High       Chol/HDL Ratio 0.0 - 5.0 ratio 3.4  3.0 CM  3.2 CM  6.0 High  CM  5.3 High  CM     Comment:                                   T. Chol/HDL Ratio                                              Men  Women                                1/2 Avg.Risk  3.4    3.3                                    Avg.Risk  5.0    4.4                                 2X Avg.Risk  9.6    7.1  3X Avg.Risk 23.4   11.0   Estimated CHD Risk 0.0 - 1.0 times avg. 0.5   < 0.5 CM   1.3 High  CM  1.1 High  CM     Comment: The CHD Risk is based on the T. Chol/HDL ratio. Other  factors affect CHD Risk such as hypertension, smoking,  diabetes, severe obesity, and family history of  premature CHD.   TSH 0.450 - 4.500 uIU/mL 1.670  1.650   1.180  1.430     T4, Total 4.5 - 12.0 ug/dL 4.7  4.2 Low    4.8  4.7      T3 Uptake Ratio 24 - 39 % _0 Free Thyroxine Index 1.2 - 4.9 1.4  1.1 Low    1.5  1.4     Prostate Specific Ag, Serum 0.0 - 4.0 ng/mL 0.9  0.8 CM   0.7 CM  0.7 CM     Comment: Roche ECLIA methodology.  According to the American Urological Association, Serum PSA should  decrease and remain at undetectable levels after radical  prostatectomy. The AUA defines biochemical recurrence as an initial  PSA value 0.2 ng/mL or greater followed by a subsequent confirmatory  PSA value 0.2 ng/mL or greater.  Values obtained with different assay methods or kits cannot be used  interchangeably. Results cannot be interpreted as absolute evidence  of the presence or absence of malignant disease.   WBC 3.4 - 10.8 x10E3/uL 5.6  5.1   5.3  4.7     RBC 4.14 - 5.80 x10E6/uL 5.36  5.28   5.37  5.10     Hemoglobin 13.0 - 17.7 g/dL 16.6  16.7   16.6  15.8     Hematocrit 37.5 - 51.0 % 49.9  48.9   49.3  46.4     MCV 79 - 97 fL 93  93   92  91     MCH 26.6 - 33.0 pg 31.0  31.6   30.9  31.0     MCHC 31.5 - 35.7 g/dL 33.3  34.2   33.7  34.1     RDW 11.6 - 15.4 % 12.4  12.4   12.4  12.4     Platelets 150 - 450 x10E3/uL 242  222   240  250     Neutrophils Not Estab. % 54  48   45  49     Lymphs Not Estab. % 32  35   34  33     Monocytes Not Estab. % _1 Eos Not Estab. % _2 Basos Not Estab. % _3 Neutrophils Absolute 1.4 - 7.0 x10E3/uL 2.9  2.4   2.4  2.3     Lymphocytes Absolute 0.7 - 3.1 x10E3/uL 1.8  1.8   1.8  1.5     Monocytes Absolute 0.1 - 0.9 x10E3/uL 0.6  0.6   0.6  0.5     EOS (ABSOLUTE) 0.0 - 0.4 x10E3/uL 0.2  0.3   0.4  0.2     Basophils Absolute 0.0 - 0.2 x10E3/uL 0.1  0.1   0.1  0.1     Immature Granulocytes Not Estab. % 0  0   0  0     Immature Grans  _________________________________________  EKG Sinus  Rhythm  WITHIN NORMAL  LIMITS  ____________________________________________    ____________________________________________   INITIAL IMPRESSION / ASSESSMENT AND PLAN  As part of my medical decision making, I reviewed the following data within the [facility]      Discussed no acute findings on labs and EKG.        ____________________________________________   FINAL CLINICAL IMPRESSION  Well exam   ED Discharge Orders     None        Note:  This document was prepared using Dragon voice recognition software and may include unintentional dictation errors.

## 2022-04-14 NOTE — Progress Notes (Unsigned)
[facility]  _______________________________   None    (approximate)  I have reviewed the triage vital signs and the nursing notes.   HISTORY  Chief Complaint No chief complaint on file.    HPI Kevin Berry is a 56 y.o. male patient presents for annual physical exam.  Patient voices no concerns or complaints.  Requests a refill on Lipitor.         Past Medical History:  Diagnosis Date   Acute medial meniscus tear of left knee    Hypercholesterolemia    Overweight    Plantar fasciitis    Rotator cuff tear 01/2013    Patient Active Problem List   Diagnosis Date Noted   Closed fracture of distal phalanx of left index finger 10/17/2021   Other hyperlipidemia 04/07/2019   Leukocytes in urine 04/07/2019   Overweight (BMI 25.0-29.9) 04/07/2019   Injury of triangular fibrocartilage complex of left wrist 04/07/2019   Left wrist pain 03/14/2019   DDD (degenerative disc disease), cervical 05/08/2015   Neck muscle spasm 05/08/2015   Derangement of medial meniscus 06/04/2012    Past Surgical History:  Procedure Laterality Date   COLONOSCOPY WITH PROPOFOL N/A 02/22/2016   Procedure: COLONOSCOPY WITH PROPOFOL;  Surgeon: Christena Deem, MD;  Location: [facility] ENDOSCOPY;  Service: Endoscopy;  Laterality: N/A;   MENISCUS REPAIR Left    ROTATOR CUFF REPAIR Right    TONSILLECTOMY      Prior to Admission medications   Medication Sig Start Date End Date Taking? Authorizing Provider  atorvastatin (LIPITOR) 40 MG tablet Take 1 tablet (40 mg total) by mouth daily. 04/02/21   Joni Reining, PA-C  Multiple Vitamins-Minerals (MULTIVITAMIN PO) Take by mouth.    [provider]    Allergies Sulfa antibiotics  Family History  Problem Relation Age of Onset   Diabetes Father    Melanoma Father    Hypothyroidism Father    Macular degeneration Mother     Social History Social History   Tobacco Use   Smoking status: Never    Smokeless tobacco: Never  Substance Use Topics   Alcohol use: No   Drug use: No    Review of Systems Constitutional: No fever/chills Eyes: No visual changes. ENT: No sore throat. Cardiovascular: Denies chest pain. Respiratory: Denies shortness of breath. Gastrointestinal: No abdominal pain.  No nausea, no vomiting.  No diarrhea.  No constipation. Genitourinary: Negative for dysuria. Musculoskeletal: Negative for back pain. Skin: Negative for rash. Neurological: Negative for headaches, focal weakness or numbness. Endocrine: Hyperlipidemia Allergic/Immunilogical: Sulfa antibiotics ____________________________________________   PHYSICAL EXAM:  VITAL SIGNS: @EDTRIAGEVITALS @ {** Revise as appropriate then delete this line - 8 systems required **} Constitutional: Alert and oriented. Well appearing and in no acute distress. Eyes: Conjunctivae are normal. PERRL. EOMI. Head: Atraumatic. Nose: No congestion/rhinnorhea. Mouth/Throat: Mucous membranes are moist.  Oropharynx non-erythematous. Neck: No stridor.  {**No cervical spine tenderness to palpation.**} {**Hematological/Lymphatic/Immunilogical: No cervical lymphadenopathy. **}Cardiovascular: Normal rate, regular rhythm. Grossly normal heart sounds.  Good peripheral circulation. Respiratory: Normal respiratory effort.  No retractions. Lungs CTAB. Gastrointestinal: Soft and nontender. No distention. No abdominal bruits. No CVA tenderness. {**Genitourinary:  **}Musculoskeletal: No lower extremity tenderness nor edema.  No joint effusions. Neurologic:  Normal speech and language. No gross focal neurologic deficits are appreciated. No gait instability. Skin:  Skin is warm, dry and intact. No rash noted. Psychiatric: Mood and affect are normal. Speech and behavior are normal.  ____________________________________________   LABS (all labs  ordered are listed, but only abnormal results are  displayed)  @EDLABS @ ____________________________________________  EKG  *** ____________________________________________  RADIOLOGY I, , personally viewed and evaluated these images (plain radiographs) as part of my medical decision making, as well as reviewing the written report by the radiologist.  ED MD interpretation:  ***  Official radiology report(s): No results found.  ____________________________________________   PROCEDURES  Procedure(s) performed (including Critical Care):  Procedures   ____________________________________________   INITIAL IMPRESSION / ASSESSMENT AND PLAN / ED COURSE  As part of my medical decision making, I reviewed the following data within the electronic MEDICAL RECORD NUMBER {[id number]::"Notes from prior ED visits"," Controlled Substance Database"}        ***  @EDCOURSE @   ____________________________________________   FINAL CLINICAL IMPRESSION(S) / ED DIAGNOSES  @EDCI @   ED Discharge Orders     None        Note:  This document was prepared using Dragon voice recognition software and may include unintentional dictation errors.

## 2022-07-23 ENCOUNTER — Ambulatory Visit: Payer: Self-pay

## 2022-07-23 NOTE — Progress Notes (Signed)
Flu vaccine administered without difficulty after consent signed in RD.

## 2022-08-22 DIAGNOSIS — H524 Presbyopia: Secondary | ICD-10-CM | POA: Diagnosis not present

## 2022-11-06 ENCOUNTER — Encounter: Payer: Self-pay | Admitting: Physician Assistant

## 2022-11-06 ENCOUNTER — Ambulatory Visit: Payer: Self-pay | Admitting: Physician Assistant

## 2022-11-06 VITALS — BP 126/84 | HR 57 | Temp 98.0°F | Resp 14 | Wt 222.0 lb

## 2022-11-06 DIAGNOSIS — R5383 Other fatigue: Secondary | ICD-10-CM

## 2022-11-06 NOTE — Progress Notes (Signed)
   Subjective: Fatigue    Patient ID: Kevin Berry, male    DOB: 1966-09-17, 57 y.o.   MRN: [id number]  HPI Patient states noticed increased fatigue in the past 2 months.  Patient states sleeps 7 hours a night.  Complaining of increased daytime drowsiness and lack of energy.  Patient also has decreased libido.   Review of Systems Degenerative disc disease of the cervical spine.  Hyperlipidemia.    Objective:   Physical Exam No acute distress.  BP is 126/84, pulse 57, respiration 14, temperature 98, patient is 96% O2 sat on room air.  Patient weighs 222 pounds BMI is 27.75. HEENT is unremarkable. Neck is supple followed lymphadenectomy or bruits. Lungs are clear to auscultation. Heart is bradycardic at 54 bpm.       Assessment & Plan: Fatigue   Further evaluation with lab work consisting of vitamin D, vitamin B, TSH, ANA, and CRP.  Will consider sleep study if labs unremarkable.

## 2022-11-06 NOTE — Addendum Note (Signed)
Addended by: Malachy Moan F on: 11/06/2022 09:00 AM   Modules accepted: Orders

## 2022-11-06 NOTE — Progress Notes (Signed)
Stated for last 2 months noted increased fatigue and daytime sleepiness noted more than usual.  Takes daily vitamin and stated sleeps usually 7 hrs a night weeknights and able to fall and stay asleep.

## 2022-11-10 ENCOUNTER — Encounter: Payer: Self-pay | Admitting: Physician Assistant

## 2022-11-10 ENCOUNTER — Ambulatory Visit: Payer: 59 | Admitting: Physician Assistant

## 2022-11-10 DIAGNOSIS — Z1152 Encounter for screening for COVID-19: Secondary | ICD-10-CM

## 2022-11-10 DIAGNOSIS — J09X2 Influenza due to identified novel influenza A virus with other respiratory manifestations: Secondary | ICD-10-CM

## 2022-11-10 LAB — POCT INFLUENZA A/B
Influenza A, POC: POSITIVE — AB
Influenza B, POC: NEGATIVE

## 2022-11-10 LAB — POC COVID19 BINAXNOW: SARS Coronavirus 2 Ag: NEGATIVE

## 2022-11-10 MED ORDER — IBUPROFEN 800 MG PO TABS: 800.0000 mg | ORAL_TABLET | Freq: Three times a day (TID) | ORAL | 0 refills | Status: DC | PRN

## 2022-11-10 MED ORDER — PROMETHAZINE-DM 6.25-15 MG/5ML PO SYRP: 5.0000 mL | ORAL_SOLUTION | Freq: Four times a day (QID) | ORAL | 0 refills | Status: DC | PRN

## 2022-11-10 MED ORDER — OSELTAMIVIR PHOSPHATE 75 MG PO CAPS: 75.0000 mg | ORAL_CAPSULE | Freq: Two times a day (BID) | ORAL | 0 refills | Status: DC

## 2022-11-10 NOTE — Progress Notes (Signed)
   Subjective: Fever and cough    Patient ID: Kevin Berry, male    DOB: 1966-02-05, 57 y.o.   MRN: [id number]  HPI Patient complain of 2 days of fever and cough.  Patient denies recent travel or known contact with COVID-19.  Patient test positive for influenza A.   Review of Systems Hyperlipidemia    Objective:   Physical Exam  Deferred secondary to telephonic encounter.      Assessment & Plan: Influenza A   Patient given a prescription for Tamiflu, Phenergan DM, and ibuprofen.  Advised take medication as directed and follow-up in 3 to 5 days if no improvement or worsening complaint.

## 2022-11-10 NOTE — Progress Notes (Signed)
Fever, congestion, head ache, body aches cough, and post nasal drainage since 11-08-22

## 2022-11-14 LAB — C-REACTIVE PROTEIN: CRP: 1 mg/L (ref 0–10)

## 2022-11-14 LAB — TSH: TSH: 1.51 u[IU]/mL (ref 0.450–4.500)

## 2022-11-14 LAB — SEDIMENTATION RATE: Sed Rate: 8 mm/hr (ref 0–30)

## 2022-11-14 LAB — TESTOSTERONE,FREE AND TOTAL
Testosterone, Free: 4 pg/mL — ABNORMAL LOW (ref 7.2–24.0)
Testosterone: 293 ng/dL (ref 264–916)

## 2022-11-14 LAB — VITAMIN B12: Vitamin B-12: 696 pg/mL (ref 232–1245)

## 2022-11-14 LAB — ANA W/REFLEX IF POSITIVE: Anti Nuclear Antibody (ANA): NEGATIVE

## 2022-11-14 LAB — VITAMIN D 25 HYDROXY (VIT D DEFICIENCY, FRACTURES): Vit D, 25-Hydroxy: 37.3 ng/mL (ref 30.0–100.0)

## 2022-11-24 ENCOUNTER — Encounter: Payer: Self-pay | Admitting: Physician Assistant

## 2022-11-24 ENCOUNTER — Ambulatory Visit: Payer: Self-pay | Admitting: Physician Assistant

## 2022-11-24 DIAGNOSIS — R7989 Other specified abnormal findings of blood chemistry: Secondary | ICD-10-CM

## 2022-11-24 DIAGNOSIS — R5383 Other fatigue: Secondary | ICD-10-CM

## 2022-11-24 NOTE — Progress Notes (Signed)
   Subjective: Fatigue    Patient ID: Kevin Berry, male    DOB: 1966/04/25, 57 y.o.   MRN: [id number]  HPI Patient called today for results of labs for complaint of fatigue.   Review of Systems     Objective:   Physical Exam  Deferred secondary to this being a telephonic encounter Patient labs were normal except for free testosterone which was decreased.     Assessment & Plan: Fatigue   Patient will be consulted to urology.

## 2022-11-25 NOTE — Addendum Note (Signed)
Addended by: Aliene Altes on: 11/25/2022 09:09 AM   Modules accepted: Orders

## 2023-01-20 ENCOUNTER — Encounter: Payer: Self-pay | Admitting: Urology

## 2023-01-20 ENCOUNTER — Ambulatory Visit (INDEPENDENT_AMBULATORY_CARE_PROVIDER_SITE_OTHER): Payer: 59 | Admitting: Urology

## 2023-01-20 VITALS — BP 130/85 | HR 80 | Ht 75.0 in | Wt 207.0 lb

## 2023-01-20 DIAGNOSIS — N529 Male erectile dysfunction, unspecified: Secondary | ICD-10-CM

## 2023-01-20 DIAGNOSIS — E291 Testicular hypofunction: Secondary | ICD-10-CM | POA: Diagnosis not present

## 2023-01-20 DIAGNOSIS — R5383 Other fatigue: Secondary | ICD-10-CM | POA: Diagnosis not present

## 2023-01-20 MED ORDER — TADALAFIL 5 MG PO TABS: 5.0000 mg | ORAL_TABLET | Freq: Every day | ORAL | 11 refills | Status: DC | PRN

## 2023-01-20 NOTE — Progress Notes (Signed)
   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  He has never tried medications for this before.  He has good sleep hygiene, denies snoring or sleep apnea, eats healthy, and works out.  PSA normal at 0.9.  He reports some symptoms of decreased energy, fatigue, poor sleep over the last few months.  He also had a borderline low vitamin D and started supplementation a few months ago.  He is interested in potential medications for ED, as well as potentially options for low testosterone.  Does not desire any further biologic pregnancies.   PMH: Past Medical History:  Diagnosis Date   Acute medial meniscus tear of left knee    Hypercholesterolemia    Overweight    Plantar fasciitis    Rotator cuff tear 01/2013    Surgical History: Past Surgical History:  Procedure Laterality Date   COLONOSCOPY WITH PROPOFOL N/A 02/22/2016   Procedure: COLONOSCOPY WITH PROPOFOL;  Surgeon: Christena Deem, MD;  Location: [facility] ENDOSCOPY;  Service: Endoscopy;  Laterality: N/A;   MENISCUS REPAIR Left    ROTATOR CUFF REPAIR Right    TONSILLECTOMY       Family History: Family History  Problem Relation Age of Onset   Diabetes Father    Melanoma Father    Hypothyroidism Father    Macular degeneration Mother     Social History:  reports that he has never smoked. He has never used smokeless tobacco. He reports that he does not drink alcohol and does not use drugs.  Physical Exam: BP 130/85 (BP Location: Left Arm, Patient Position: Sitting, Cuff Size: Normal)   Pulse 80   Ht 6\' 3"  (1.905 m)   Wt 207 lb (93.9 kg)   BMI 25.87 kg/m    Constitutional:  Alert and oriented, No acute distress. Cardiovascular: No clubbing, cyanosis, or edema. Respiratory: Normal respiratory effort, no increased work of breathing. GI: Abdomen is soft, nontender, nondistended, no abdominal  masses   Laboratory Data: Reviewed, see HPI  Assessment & Plan:   57 year old very healthy male with a borderline low testosterone level of 293, mild ED, and a borderline low vitamin D level.  He has mild ED, is interested in trying medications.  We reviewed the AUA guidelines regarding evaluation and management of patients with low testosterone, and that 2 values <300 are required to consider testosterone replacement.  We also discussed testicular atrophy and potential dependence on testosterone.  We reviewed alternative options like Clomid, or behavioral and diet strategies.  He is hesitant to pursue testosterone if this is a medication he would likely require long-term, but could potentially be interested in Clomid.  Trial of Cialis 5 mg as needed Repeat morning blood work with testosterone and LH, call with results.  If low testosterone consider trial of Clomid   Legrand Rams, MD 01/20/2023  [facility] [address], [address] [place], [place] [address] [phone]

## 2023-01-20 NOTE — Patient Instructions (Addendum)
[email or web address]   Hypogonadism, Male  Male hypogonadism is a condition of having a level of testosterone that is lower than normal. Testosterone is a chemical, or hormone, that is made mainly in the testicles. In boys, testosterone is responsible for the development of male characteristics during puberty. These include: Making the penis bigger. Growing and building the muscles. Growing facial hair. Deepening the voice. In adult men, testosterone is responsible for maintaining: An interest in sex and the ability to have sex. Muscle mass. Sperm production. Red blood cell production. Bone strength. Testosterone also gives men energy and a sense of well-being. Testosterone normally decreases as men age and the testicles make less testosterone. Testosterone levels can vary from man to man. Not all men will have signs and symptoms of low testosterone. Weight, alcohol use, medicines, and certain medical conditions can affect a man's testosterone level. What are the causes? This condition is caused by: A natural decrease in testosterone that occurs as a man grows older. This is the main cause of this condition. Use of medicines, such as antidepressants, steroids, and opioids. Diseases and conditions that affect the testicles or the making of testosterone. These include: Injury or damage to the testicles from trauma, cancer, cancer treatment, or infection. Diabetes. Sleep apnea. Genetic conditions that men are born with. Disease of the pituitary gland. This gland is in the brain. It produces hormones. Obesity. Metabolic syndrome. This is a group of diseases that affect blood pressure, blood sugar, cholesterol, and belly fat. HIV or AIDS. Alcohol abuse. Kidney failure. Other long-term or chronic diseases. What are the signs or symptoms? Common symptoms of this condition include: Loss of interest in sex (low sex drive). Inability to have or maintain an erection (erectile  dysfunction). Feeling tired (fatigue). Mood changes, like irritability or depression. Loss of muscle and body hair. Infertility. Large breasts. Weight gain (obesity). How is this diagnosed? Your health care provider can diagnose hypogonadism based on: Your signs and symptoms. A physical exam to check your testosterone levels. This includes blood tests. Testosterone levels can change throughout the day. Levels are highest in the morning. You may need to have repeat blood tests before getting a diagnosis of hypogonadism. Depending on your medical history and test results, your health care provider may also do other tests to find the cause of low testosterone. How is this treated? This condition is treated with testosterone replacement therapy. Testosterone can be given by: Injection or through pellets inserted under the skin. Gels or patches placed on the skin or in the mouth. Testosterone therapy is not for everyone. It has risks and side effects. Your health care provider will consider your medical history, your risk for prostate cancer, your age, and your symptoms before putting you on testosterone replacement therapy. Follow these instructions at home: Take over-the-counter and prescription medicines only as told by your health care provider. Eat foods that are high in fiber, such as beans, whole grains, and fresh fruits and vegetables. Limit foods that are high in fat and processed sugars, such as fried or sweet foods. If you drink alcohol: Limit how much you have to 0-2 drinks a day. Know how much alcohol is in your drink. In the [place] one drink equals one 12 oz bottle of beer (355 mL), one 5 oz glass of wine (148 mL), or one 1 oz glass of hard liquor (44 mL). Return to your normal activities as told by your health care provider. Ask your health care provider what activities are safe for  you. Keep all follow-up visits. This is important. Contact a health care provider if: You have any  of the signs or symptoms of low testosterone. You have any side effects from testosterone therapy. Summary Male hypogonadism is a condition of having a level of testosterone that is lower than normal. The natural drop in testosterone production that occurs with age is the most common cause of this condition. Low testosterone can also be caused by many diseases and conditions that affect the testicles and the making of testosterone. This condition is treated with testosterone replacement therapy. There are risks and side effects of testosterone therapy. Your health care provider will consider your age, medical history, symptoms, and risks for prostate cancer before putting you on testosterone therapy. This information is not intended to replace advice given to you by your health care provider. Make sure you discuss any questions you have with your health care provider. Document Revised: 05/10/2020 Document Reviewed: 05/10/2020 Elsevier Patient Education  2023 [employer].

## 2023-01-27 ENCOUNTER — Other Ambulatory Visit
Admission: RE | Admit: 2023-01-27 | Discharge: 2023-01-27 | Disposition: A | Discharge status: Home / Self Care | Payer: 59 | Attending: Urology | Admitting: Urology

## 2023-01-27 DIAGNOSIS — R5383 Other fatigue: Secondary | ICD-10-CM | POA: Insufficient documentation

## 2023-01-27 DIAGNOSIS — N529 Male erectile dysfunction, unspecified: Secondary | ICD-10-CM | POA: Diagnosis not present

## 2023-01-29 LAB — LUTEINIZING HORMONE: LH: 5.3 m[IU]/mL (ref 1.7–8.6)

## 2023-01-29 LAB — TESTOSTERONE: Testosterone: 350 ng/dL (ref 264–916)

## 2023-04-08 ENCOUNTER — Other Ambulatory Visit: Payer: Self-pay | Admitting: Physician Assistant

## 2023-04-10 ENCOUNTER — Other Ambulatory Visit: Payer: Self-pay

## 2023-04-10 ENCOUNTER — Ambulatory Visit: Payer: Self-pay

## 2023-04-10 DIAGNOSIS — E7849 Other hyperlipidemia: Secondary | ICD-10-CM

## 2023-04-10 DIAGNOSIS — Z0000 Encounter for general adult medical examination without abnormal findings: Secondary | ICD-10-CM

## 2023-04-10 LAB — POCT URINALYSIS DIPSTICK
Bilirubin, UA: NEGATIVE
Blood, UA: NEGATIVE
Glucose, UA: NEGATIVE
Ketones, UA: NEGATIVE
Leukocytes, UA: NEGATIVE
Nitrite, UA: NEGATIVE
Protein, UA: NEGATIVE
Spec Grav, UA: 1.015 (ref 1.010–1.025)
Urobilinogen, UA: 0.2 E.U./dL
pH, UA: 7.5 (ref 5.0–8.0)

## 2023-04-10 MED ORDER — ATORVASTATIN CALCIUM 40 MG PO TABS
40.0000 mg | ORAL_TABLET | Freq: Every day | ORAL | 3 refills | Status: DC
Start: 2023-04-10 — End: 2024-04-07

## 2023-04-11 LAB — CMP12+LP+TP+TSH+6AC+PSA+CBC…
ALT: 22 IU/L (ref 0–44)
AST: 18 IU/L (ref 0–40)
Albumin: 4.3 g/dL (ref 3.8–4.9)
Alkaline Phosphatase: 54 IU/L (ref 44–121)
BUN/Creatinine Ratio: 18 (ref 9–20)
BUN: 20 mg/dL (ref 6–24)
Basophils Absolute: 0.1 10*3/uL (ref 0.0–0.2)
Basos: 1 %
Bilirubin Total: 0.7 mg/dL (ref 0.0–1.2)
Calcium: 9 mg/dL (ref 8.7–10.2)
Chloride: 104 mmol/L (ref 96–106)
Chol/HDL Ratio: 3 ratio (ref 0.0–5.0)
Cholesterol, Total: 120 mg/dL (ref 100–199)
Creatinine, Ser: 1.11 mg/dL (ref 0.76–1.27)
EOS (ABSOLUTE): 0.2 10*3/uL (ref 0.0–0.4)
Eos: 3 %
Estimated CHD Risk: <0.5 times avg. (ref 0.0–1.0)
Free Thyroxine Index: 1.6 (ref 1.2–4.9)
GGT: 23 IU/L (ref 0–65)
Globulin, Total: 2.3 g/dL (ref 1.5–4.5)
Glucose: 90 mg/dL (ref 70–99)
HDL: 40 mg/dL (ref >39)
Hematocrit: 47.3 % (ref 37.5–51.0)
Hemoglobin: 16.4 g/dL (ref 13.0–17.7)
Immature Grans (Abs): 0 10*3/uL (ref 0.0–0.1)
Immature Granulocytes: 0 %
Iron: 93 ug/dL (ref 38–169)
LDH: 175 IU/L (ref 121–224)
LDL Chol Calc (NIH): 60 mg/dL (ref 0–99)
Lymphocytes Absolute: 1.6 10*3/uL (ref 0.7–3.1)
Lymphs: 31 %
MCH: 31.4 pg (ref 26.6–33.0)
MCHC: 34.7 g/dL (ref 31.5–35.7)
MCV: 91 fL (ref 79–97)
Monocytes Absolute: 0.5 10*3/uL (ref 0.1–0.9)
Monocytes: 11 %
Neutrophils Absolute: 2.8 10*3/uL (ref 1.4–7.0)
Neutrophils: 54 %
Phosphorus: 2.7 mg/dL — ABNORMAL LOW (ref 2.8–4.1)
Platelets: 217 10*3/uL (ref 150–450)
Potassium: 4.5 mmol/L (ref 3.5–5.2)
Prostate Specific Ag, Serum: 1 ng/mL (ref 0.0–4.0)
RBC: 5.22 x10E6/uL (ref 4.14–5.80)
RDW: 12 % (ref 11.6–15.4)
Sodium: 141 mmol/L (ref 134–144)
T3 Uptake Ratio: 30 % (ref 24–39)
T4, Total: 5.2 ug/dL (ref 4.5–12.0)
TSH: 1.45 u[IU]/mL (ref 0.450–4.500)
Total Protein: 6.6 g/dL (ref 6.0–8.5)
Triglycerides: 106 mg/dL (ref 0–149)
Uric Acid: 6.3 mg/dL (ref 3.8–8.4)
VLDL Cholesterol Cal: 20 mg/dL (ref 5–40)
WBC: 5.1 10*3/uL (ref 3.4–10.8)
eGFR: 76 mL/min/{1.73_m2} (ref >59)

## 2023-04-16 ENCOUNTER — Encounter: Payer: Self-pay | Admitting: Physician Assistant

## 2023-04-16 ENCOUNTER — Ambulatory Visit: Payer: Self-pay | Admitting: Physician Assistant

## 2023-04-16 VITALS — BP 114/81 | HR 78 | Temp 97.5°F | Resp 12 | Ht 74.0 in | Wt 214.0 lb

## 2023-04-16 DIAGNOSIS — Z0000 Encounter for general adult medical examination without abnormal findings: Secondary | ICD-10-CM

## 2023-04-16 NOTE — Progress Notes (Signed)
[facility] ____________________________________________   None    (approximate)  I have reviewed the triage vital signs and the nursing notes.   HISTORY  Chief Complaint Annual Exam   HPI Kevin Berry is a 57 y.o. male patient present annual physical exam.  Patient with no concerns or complaints.         Past Medical History:  Diagnosis Date   Acute medial meniscus tear of left knee    Hypercholesterolemia    Overweight    Plantar fasciitis    Rotator cuff tear 01/2013    Patient Active Problem List   Diagnosis Date Noted   Closed fracture of distal phalanx of left index finger 10/17/2021   Other hyperlipidemia 04/07/2019   Leukocytes in urine 04/07/2019   Overweight (BMI 25.0-29.9) 04/07/2019   Injury of triangular fibrocartilage complex of left wrist 04/07/2019   Left wrist pain 03/14/2019   DDD (degenerative disc disease), cervical 05/08/2015   Neck muscle spasm 05/08/2015   Derangement of medial meniscus 06/04/2012    Past Surgical History:  Procedure Laterality Date   COLONOSCOPY WITH PROPOFOL N/A 02/22/2016   Procedure: COLONOSCOPY WITH PROPOFOL;  Surgeon: Christena Deem, MD;  Location: [facility] ENDOSCOPY;  Service: Endoscopy;  Laterality: N/A;   MENISCUS REPAIR Left    ROTATOR CUFF REPAIR Right    TONSILLECTOMY      Prior to Admission medications   Medication Sig Start Date End Date Taking? Authorizing Provider  atorvastatin (LIPITOR) 40 MG tablet Take 1 tablet (40 mg total) by mouth daily. 04/10/23  Yes Joni Reining, PA-C  Multiple Vitamins-Minerals (MULTIVITAMIN PO) Take by mouth.   Yes [provider]  tadalafil (CIALIS) 5 MG tablet Take 1 tablet (5 mg total) by mouth daily as needed for erectile dysfunction (take 1 hour prior to sexual activity). 01/20/23  Yes Sondra Come, MD    Allergies Sulfa antibiotics  Family History  Problem Relation Age of Onset   Diabetes Father    Melanoma Father     Hypothyroidism Father    Macular degeneration Mother     Social History Social History   Tobacco Use   Smoking status: Never   Smokeless tobacco: Never  Substance Use Topics   Alcohol use: No   Drug use: No    Review of Systems Constitutional: No fever/chills Eyes: No visual changes. ENT: No sore throat. Cardiovascular: Denies chest pain. Respiratory: Denies shortness of breath. Gastrointestinal: No abdominal pain.  No nausea, no vomiting.  No diarrhea.  No constipation. Genitourinary: Negative for dysuria. Musculoskeletal: Negative for back pain. Skin: Negative for rash. Neurological: Negative for headaches, focal weakness or numbness. Endocrine: Hyperlipidemia Allergic/Immunilogical: Sulfa antibiotics ____________________________________________   PHYSICAL EXAM:  VITAL SIGNS: BP 114/81  BP Location Left Arm  Patient Position Sitting  Cuff Size Large  Pulse 78  Resp 12  Temp 97.5 F (36.4 C)  Temp src Temporal  SpO2 97 %  Weight 214 lb (97.1 kg)  Height 6\' 2"  (1.88 m)   BMI 27.48 kg/m2  BSA 2.25 m2  Tobacco  Smoking status Never  Smokeless status Never   Constitutional: Alert and oriented. Well appearing and in no acute distress. Eyes: Conjunctivae are normal. PERRL. EOMI. Head: Atraumatic. Nose: No congestion/rhinnorhea. Mouth/Throat: Mucous membranes are moist.  Oropharynx non-erythematous. Neck: No stridor. No cervical spine tenderness to palpation. Hematological/Lymphatic/Immunilogical: No cervical lymphadenopathy. Cardiovascular: Normal rate, regular rhythm. Grossly normal heart sounds.  Good peripheral circulation. Respiratory: Normal respiratory effort.  No retractions. Lungs CTAB. Gastrointestinal: Soft and nontender. No distention. No abdominal bruits. No CVA tenderness. Genitourinary: Deferred Musculoskeletal: No lower extremity tenderness nor edema.  No joint effusions. Neurologic:  Normal speech and language. No gross focal neurologic  deficits are appreciated. No gait instability. Skin:  Skin is warm, dry and intact. No rash noted. Psychiatric: Mood and affect are normal. Speech and behavior are normal.  ____________________________________________   LABS           Component Ref Range & Units 6 d ago (04/10/23) 1 yr ago (04/04/22) 1 yr ago (02/03/22) 2 yr ago (04/02/21) 3 yr ago (03/29/20) 4 yr ago (04/07/19) 4 yr ago (04/05/19)  Color, UA Yellow dark yellow yellow YELLOW Dark Yellow yellow Yellow  Clarity, UA Clearn clear clear clear Clear clear Clear  Glucose, UA Negative Negative Negative Negative Negative Negative Negative Negative  Bilirubin, UA Negative negative negative negative Negative neg Negative  Ketones, UA Negative negative negative negative Negative 1+ 1+ CM  Spec Grav, UA 1.010 - 1.025 1.015 1.025 1.020 1.015 1.015 1.015 1.025  Blood, UA Negative negative negativ e negative Negative neg Negative  pH, UA 5.0 - 8.0 7.5 6.0 6.0 7.5 7.0 7.0 6.0  Protein, UA Negative Negative Positive Abnormal  CM Negative Negative Negative Negative Positive Abnormal  CM  Urobilinogen, UA 0.2 or 1.0 E.U./dL 0.2 0.2 1.0 0.2 0.2 0.2 0.2  Nitrite, UA Negative negative negativ e negative Negative neg Negative  Leukocytes, UA Negative Negative Negative Negative Negative Negative Small (1+) Abnormal  Small (1+) Abnormal   Appearance    clear     Odor                     View All Conversations on this Encounter                 Component Ref Range & Units 6 d ago (04/10/23) 5 mo ago (11/06/22) 1 yr ago (04/04/22) 2 yr ago (04/02/21) 2 yr ago (07/03/20) 3 yr ago (03/29/20) 4 yr ago (04/05/19)  Glucose 70 - 99 mg/dL 90  95 87 R  84 R 86 R  Uric Acid 3.8 - 8.4 mg/dL 6.3  5.8 CM 6.3 CM  6.2 CM 6.5 R, CM  Comment:            Therapeutic target for gout patients: <6.0  BUN 6 - 24 mg/dL 20  21 15  19 17   Creatinine, Ser 0.76 - 1.27 mg/dL 1.61  0.96 0.45  4.09 8.11  eGFR >59 mL/min/1.73 76  81 86     BUN/Creatinine  Ratio 9 - 20 18  20 15  19 16   Sodium 134 - 144 mmol/L 141  143 140  143 143  Potassium 3.5 - 5.2 mmol/L 4.5  4.3 4.4  4.9 4.5  Chloride 96 - 106 mmol/L 104  103 103  104 106  Calcium 8.7 - 10.2 mg/dL 9.0  9.2 8.9  9.5 8.9  Phosphorus 2.8 - 4.1 mg/dL 2.7 Low   3.5 3.0  3.5 3.0  Total Protein 6.0 - 8.5 g/dL 6.6  6.9 6.9  6.8 6.5  Albumin 3.8 - 4.9 g/dL 4.3  4.5 CM 4.5  4.3 4.3  Globulin, Total 1.5 - 4.5 g/dL 2.3  2.4 2.4  2.5 2.2  Bilirubin Total 0.0 - 1.2 mg/dL 0.7  0.8 0.8  0.6 0.6  Alkaline Phosphatase 44 - 121 IU/L 54  56 57  51 R 44 R  LDH  121 - 224 IU/L 175  182 174  181 164  AST 0 - 40 IU/L 18  27 20  21 16   ALT 0 - 44 IU/L 22  36 30  21 17   GGT 0 - 65 IU/L 23  33 25  21 19   Iron 38 - 169 ug/dL 93  846 962  952 High  143  Cholesterol, Total 100 - 199 mg/dL 841  324 401 027 253 High  202 High   Triglycerides 0 - 149 mg/dL 664  403 High  474 259 High  247 High  155 High   HDL >39 mg/dL 40  45 46 46 40 38 Low   VLDL Cholesterol Cal 5 - 40 mg/dL 20  27 23 27  46 High  31  LDL Chol Calc (NIH) 0 - 99 mg/dL 60  79 69 76 563 High    Chol/HDL Ratio 0.0 - 5.0 ratio 3.0  3.4 CM 3.0 CM 3.2 CM 6.0 High  CM 5.3 High  CM  Comment:                                   T. Chol/HDL Ratio                                             Men  Women                               1/2 Avg.Risk  3.4    3.3                                   Avg.Risk  5.0    4.4                                2X Avg.Risk  9.6    7.1                                3X Avg.Risk 23.4   11.0  Estimated CHD Risk 0.0 - 1.0 times avg.  < 0.5  0.5 CM  < 0.5 CM  1.3 High  CM 1.1 High  CM  Comment: The CHD Risk is based on the T. Chol/HDL ratio. Other factors affect CHD Risk such as hypertension, smoking, diabetes, severe obesity, and family history of premature CHD.  TSH 0.450 - 4.500 uIU/mL 1.450 1.510 1.670 1.650  1.180 1.430  T4, Total 4.5 - 12.0 ug/dL 5.2  4.7 4.2 Low   4.8 4.7  T3 Uptake Ratio 24 - 39 % 30   29 26  31 30   Free Thyroxine Index 1.2 - 4.9 1.6  1.4 1.1 Low   1.5 1.4  Prostate Specific Ag, Serum 0.0 - 4.0 ng/mL 1.0  0.9 CM 0.8 CM  0.7 CM 0.7 CM  Comment: Roche ECLIA methodology. According to the American Urological Association, Serum PSA should decrease and remain at undetectable levels after radical prostatectomy. The AUA defines biochemical recurrence as an initial PSA value 0.2 ng/mL or greater followed by a subsequent confirmatory PSA value 0.2  ng/mL or greater. Values obtained with different assay methods or kits cannot be used interchangeably. Results cannot be interpreted as absolute evidence of the presence or absence of malignant disease.  WBC 3.4 - 10.8 x10E3/uL 5.1  5.6 5.1  5.3 4.7  RBC 4.14 - 5.80 x10E6/uL 5.22  5.36 5.28  5.37 5.10  Hemoglobin 13.0 - 17.7 g/dL 65.7  84.6 96.2  95.2 84.1  Hematocrit 37.5 - 51.0 % 47.3  49.9 48.9  49.3 46.4  MCV 79 - 97 fL 91  93 93  92 91  MCH 26.6 - 33.0 pg 31.4  31.0 31.6  30.9 31.0  MCHC 31.5 - 35.7 g/dL 32.4  40.1 02.7  25.3 66.4  RDW 11.6 - 15.4 % 12.0  12.4 12.4  12.4 12.4  Platelets 150 - 450 x10E3/uL 217  242 222  240 250  Neutrophils Not Estab. % 54  54 48  45 49  Lymphs Not Estab. % 31  32 35  34 33  Monocytes Not Estab. % 11  10 11  11 12   Eos Not Estab. % 3  3 5  8 5   Basos Not Estab. % 1  1 1  2 1   Neutrophils Absolute 1.4 - 7.0 x10E3/uL 2.8  2.9 2.4  2.4 2.3  Lymphocytes Absolute 0.7 - 3.1 x10E3/uL 1.6  1.8 1.8  1.8 1.5  Monocytes Absolute 0.1 - 0.9 x10E3/uL 0.5  0.6 0.6  0.6 0.5  EOS (ABSOLUTE) 0.0 - 0.4 x10E3/uL 0.2  0.2 0.3  0.4 0.2  Basophils Absolute 0.0 - 0.2 x10E3/uL 0.1  0.1 0.1  0.1 0.1  Immature Granulocytes Not Estab. % 0  0 0  0 0  Immature Grans (Abs)              ____________________________________________  EKG  Sinus rhythm at 61 bpm ____________________________________________   ____________________________________________   INITIAL IMPRESSION / ASSESSMENT AND  PLAN  As part of my medical decision making, I reviewed the following data within the electronic MEDICAL RECORD NUMBER      No acute findings on physical exam, labs, and EKG.        ____________________________________________   FINAL CLINICAL IMPRESSION Well exam   ED Discharge Orders     None        Note:  This document was prepared using Dragon voice recognition software and may include unintentional dictation errors.

## 2023-04-22 DIAGNOSIS — X32XXXA Exposure to sunlight, initial encounter: Secondary | ICD-10-CM | POA: Diagnosis not present

## 2023-04-22 DIAGNOSIS — L578 Other skin changes due to chronic exposure to nonionizing radiation: Secondary | ICD-10-CM | POA: Diagnosis not present

## 2023-04-22 DIAGNOSIS — D2271 Melanocytic nevi of right lower limb, including hip: Secondary | ICD-10-CM | POA: Diagnosis not present

## 2023-04-22 DIAGNOSIS — L821 Other seborrheic keratosis: Secondary | ICD-10-CM | POA: Diagnosis not present

## 2023-04-22 DIAGNOSIS — L570 Actinic keratosis: Secondary | ICD-10-CM | POA: Diagnosis not present

## 2023-04-22 DIAGNOSIS — D225 Melanocytic nevi of trunk: Secondary | ICD-10-CM | POA: Diagnosis not present

## 2023-04-22 DIAGNOSIS — D2261 Melanocytic nevi of right upper limb, including shoulder: Secondary | ICD-10-CM | POA: Diagnosis not present

## 2023-04-22 DIAGNOSIS — D2262 Melanocytic nevi of left upper limb, including shoulder: Secondary | ICD-10-CM | POA: Diagnosis not present

## 2023-04-22 DIAGNOSIS — D2272 Melanocytic nevi of left lower limb, including hip: Secondary | ICD-10-CM | POA: Diagnosis not present

## 2023-04-28 DIAGNOSIS — D485 Neoplasm of uncertain behavior of skin: Secondary | ICD-10-CM | POA: Diagnosis not present

## 2023-04-28 DIAGNOSIS — D0359 Melanoma in situ of other part of trunk: Secondary | ICD-10-CM | POA: Diagnosis not present

## 2023-06-02 DIAGNOSIS — D0359 Melanoma in situ of other part of trunk: Secondary | ICD-10-CM | POA: Diagnosis not present

## 2023-06-02 DIAGNOSIS — D235 Other benign neoplasm of skin of trunk: Secondary | ICD-10-CM | POA: Diagnosis not present

## 2023-08-04 ENCOUNTER — Encounter: Payer: Self-pay | Admitting: Urology

## 2023-08-04 ENCOUNTER — Ambulatory Visit: Payer: 59 | Admitting: Urology

## 2023-08-04 VITALS — BP 131/78 | HR 67 | Ht 74.0 in | Wt 212.0 lb

## 2023-08-04 DIAGNOSIS — N529 Male erectile dysfunction, unspecified: Secondary | ICD-10-CM

## 2023-08-04 DIAGNOSIS — Z125 Encounter for screening for malignant neoplasm of prostate: Secondary | ICD-10-CM

## 2023-08-04 MED ORDER — TADALAFIL 5 MG PO TABS: 2.5000 mg | ORAL_TABLET | Freq: Every day | ORAL | 11 refills | Status: DC | PRN

## 2023-08-04 NOTE — Patient Instructions (Signed)

## 2023-08-04 NOTE — Progress Notes (Signed)
   08/04/2023 9:10 AM   Kevin Berry 1965-11-05 [id number]  Reason for visit: Follow up hypogonadism, ED, PSA screening  HPI: 57 year old healthy male who I originally saw in April 2024 for a borderline low testosterone level of 293, mild ED, and routine PSA screening.  At that time his vitamin D levels were low, and he opted to supplement vitamin D and recheck a testosterone.  Repeat testosterone level in May 2024 increased to normal at 350, his energy levels improved.  He also opted for trial of Cialis 2.5 to 5 mg on demand for ED which has worked well.  PSA levels have been normal, most recently 1.0 from July 2024 which has been stable over the last 5 years.  We reviewed the AUA guidelines again regarding testosterone evaluation and management and PSA screening.  Cialis refilled, can be filled by PCP moving forward Can follow-up with urology as needed  Sondra Come, MD  [facility] [address] [place], [place] [address] [phone]

## 2023-09-28 DIAGNOSIS — H524 Presbyopia: Secondary | ICD-10-CM | POA: Diagnosis not present

## 2023-12-08 DIAGNOSIS — D225 Melanocytic nevi of trunk: Secondary | ICD-10-CM | POA: Diagnosis not present

## 2023-12-08 DIAGNOSIS — D485 Neoplasm of uncertain behavior of skin: Secondary | ICD-10-CM | POA: Diagnosis not present

## 2023-12-08 DIAGNOSIS — L570 Actinic keratosis: Secondary | ICD-10-CM | POA: Diagnosis not present

## 2023-12-08 DIAGNOSIS — D2261 Melanocytic nevi of right upper limb, including shoulder: Secondary | ICD-10-CM | POA: Diagnosis not present

## 2023-12-08 DIAGNOSIS — D2272 Melanocytic nevi of left lower limb, including hip: Secondary | ICD-10-CM | POA: Diagnosis not present

## 2023-12-08 DIAGNOSIS — L820 Inflamed seborrheic keratosis: Secondary | ICD-10-CM | POA: Diagnosis not present

## 2023-12-08 DIAGNOSIS — Z85820 Personal history of malignant melanoma of skin: Secondary | ICD-10-CM | POA: Diagnosis not present

## 2023-12-08 DIAGNOSIS — D2262 Melanocytic nevi of left upper limb, including shoulder: Secondary | ICD-10-CM | POA: Diagnosis not present

## 2023-12-08 DIAGNOSIS — Z86006 Personal history of melanoma in-situ: Secondary | ICD-10-CM | POA: Diagnosis not present

## 2023-12-17 ENCOUNTER — Ambulatory Visit: Payer: Self-pay | Admitting: Physician Assistant

## 2023-12-17 ENCOUNTER — Encounter: Payer: Self-pay | Admitting: Physician Assistant

## 2023-12-17 VITALS — BP 128/77 | HR 76 | Temp 97.8°F | Resp 14 | Ht 74.0 in | Wt 212.0 lb

## 2023-12-17 DIAGNOSIS — Z024 Encounter for examination for driving license: Secondary | ICD-10-CM

## 2023-12-17 LAB — POCT URINALYSIS DIPSTICK
Bilirubin, UA: NEGATIVE
Blood, UA: NEGATIVE
Glucose, UA: NEGATIVE
Ketones, UA: NEGATIVE
Leukocytes, UA: NEGATIVE
Nitrite, UA: NEGATIVE
Protein, UA: NEGATIVE
Spec Grav, UA: 1.02 (ref 1.010–1.025)
Urobilinogen, UA: 0.2 U/dL
pH, UA: 6 (ref 5.0–8.0)

## 2023-12-17 NOTE — Progress Notes (Signed)
   Subjective: DOT recertification    Patient ID: Kevin Berry, male    DOB: [birth year], 58 y.o.   MRN: [id number]  HPI Patient presents for DOT recertification.  Voices no concerns.   Review of Systems Erectile dysfunction and hyperlipidemia.    Objective:   Physical Exam BP 128/77  Cuff Size Large  Pulse Rate 76  Temp 97.8 F (36.6 C)  Temp Source Temporal  Weight 212 lb (96.2 kg)  Height 6\' 2"  (1.88 m)  Resp 14  SpO2 96 %   BMI: 27.22 kg/m2  BSA: 2.24 m2  HEENT is unremarkable. Neck is supple lymphadenopathy or bruits. Lungs are clear to auscultation. Heart is regular rate and rhythm. Abdomen with negative HSM, normoactive bowel sounds, soft and nontender to palpation. No obvious deformity to the upper or lower extremities.  Patient has full and equal range of motion of the upper and lower extremities. No obvious deformity to the cervical lumbar spine.  Patient has full and equal range of motion of the cervical and lumbar spine. Cranial nerves II through XII are grossly intact.       Assessment & Plan: DOT recertification  Patient meets requirement for 2-year certification.

## 2023-12-17 NOTE — Progress Notes (Signed)
 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 Gretel Acre

## 2024-04-07 ENCOUNTER — Other Ambulatory Visit: Payer: Self-pay | Admitting: Physician Assistant

## 2024-04-07 ENCOUNTER — Ambulatory Visit: Payer: Self-pay

## 2024-04-07 DIAGNOSIS — E7849 Other hyperlipidemia: Secondary | ICD-10-CM

## 2024-04-07 DIAGNOSIS — Z0000 Encounter for general adult medical examination without abnormal findings: Secondary | ICD-10-CM

## 2024-04-07 LAB — POCT URINALYSIS DIPSTICK
Bilirubin, UA: NEGATIVE
Blood, UA: NEGATIVE
Glucose, UA: NEGATIVE
Ketones, UA: NEGATIVE
Leukocytes, UA: NEGATIVE
Nitrite, UA: NEGATIVE
Protein, UA: POSITIVE — AB
Spec Grav, UA: 1.015 (ref 1.010–1.025)
Urobilinogen, UA: 0.2 U/dL
pH, UA: 7 (ref 5.0–8.0)

## 2024-04-07 NOTE — Progress Notes (Signed)
Pt completed labs for scheduled physical. Kevin Berry

## 2024-04-09 LAB — CMP12+LP+TP+TSH+6AC+PSA+CBC…
ALT: 27 IU/L (ref 0–44)
AST: 24 IU/L (ref 0–40)
Albumin: 4.2 g/dL (ref 3.8–4.9)
Alkaline Phosphatase: 54 IU/L (ref 44–121)
BUN/Creatinine Ratio: 17 (ref 9–20)
BUN: 18 mg/dL (ref 6–24)
Basophils Absolute: 0.1 x10E3/uL (ref 0.0–0.2)
Basos: 1 %
Bilirubin Total: 0.8 mg/dL (ref 0.0–1.2)
Calcium: 9 mg/dL (ref 8.7–10.2)
Chloride: 105 mmol/L (ref 96–106)
Chol/HDL Ratio: 3.3 ratio (ref 0.0–5.0)
Cholesterol, Total: 130 mg/dL (ref 100–199)
Creatinine, Ser: 1.08 mg/dL (ref 0.76–1.27)
EOS (ABSOLUTE): 0.2 x10E3/uL (ref 0.0–0.4)
Eos: 3 %
Estimated CHD Risk: <0.5 times avg. (ref 0.0–1.0)
Free Thyroxine Index: 1.4 (ref 1.2–4.9)
GGT: 22 IU/L (ref 0–65)
Globulin, Total: 2.3 g/dL (ref 1.5–4.5)
Glucose: 87 mg/dL (ref 70–99)
HDL: 40 mg/dL (ref >39)
Hematocrit: 49.1 % (ref 37.5–51.0)
Hemoglobin: 16.2 g/dL (ref 13.0–17.7)
Immature Grans (Abs): 0 x10E3/uL (ref 0.0–0.1)
Immature Granulocytes: 0 %
Iron: 115 ug/dL (ref 38–169)
LDH: 177 IU/L (ref 121–224)
LDL Chol Calc (NIH): 64 mg/dL (ref 0–99)
Lymphocytes Absolute: 1.7 x10E3/uL (ref 0.7–3.1)
Lymphs: 31 %
MCH: 31.4 pg (ref 26.6–33.0)
MCHC: 33 g/dL (ref 31.5–35.7)
MCV: 95 fL (ref 79–97)
Monocytes Absolute: 0.6 x10E3/uL (ref 0.1–0.9)
Monocytes: 11 %
Neutrophils Absolute: 3 x10E3/uL (ref 1.4–7.0)
Neutrophils: 54 %
Phosphorus: 3 mg/dL (ref 2.8–4.1)
Platelets: 223 x10E3/uL (ref 150–450)
Potassium: 4.7 mmol/L (ref 3.5–5.2)
Prostate Specific Ag, Serum: 1.3 ng/mL (ref 0.0–4.0)
RBC: 5.16 x10E6/uL (ref 4.14–5.80)
RDW: 12.2 % (ref 11.6–15.4)
Sodium: 143 mmol/L (ref 134–144)
T3 Uptake Ratio: 32 % (ref 24–39)
T4, Total: 4.5 ug/dL (ref 4.5–12.0)
TSH: 1.29 u[IU]/mL (ref 0.450–4.500)
Total Protein: 6.5 g/dL (ref 6.0–8.5)
Triglycerides: 150 mg/dL — ABNORMAL HIGH (ref 0–149)
Uric Acid: 5.7 mg/dL (ref 3.8–8.4)
VLDL Cholesterol Cal: 26 mg/dL (ref 5–40)
WBC: 5.6 x10E3/uL (ref 3.4–10.8)
eGFR: 79 mL/min/1.73 (ref >59)

## 2024-04-14 ENCOUNTER — Encounter: Payer: Self-pay | Admitting: Physician Assistant

## 2024-04-14 ENCOUNTER — Ambulatory Visit: Payer: Self-pay | Admitting: Physician Assistant

## 2024-04-14 VITALS — BP 120/78 | HR 82 | Temp 98.0°F | Resp 76 | Ht 74.0 in | Wt 221.0 lb

## 2024-04-14 DIAGNOSIS — Z0000 Encounter for general adult medical examination without abnormal findings: Secondary | ICD-10-CM

## 2024-04-14 NOTE — Progress Notes (Signed)
 [facility] ____________________________________________   None    (approximate)  I have reviewed the triage vital signs and the nursing notes.   HISTORY  Chief Complaint Annual Exam   HPI Kevin Berry is a 58 y.o. male patient presents for annual physical exam.  Patient voiced concern for gastric reflux.  Patient stated has approximately 3 episodes per week.  States mild relief with over-the-counter antacids.         Past Medical History:  Diagnosis Date   Acute medial meniscus tear of left knee    Hypercholesterolemia    Overweight    Plantar fasciitis    Rotator cuff tear 01/2013    Patient Active Problem List   Diagnosis Date Noted   Closed fracture of distal phalanx of left index finger 10/17/2021   Other hyperlipidemia 04/07/2019   Leukocytes in urine 04/07/2019   Overweight (BMI 25.0-29.9) 04/07/2019   Injury of triangular fibrocartilage complex of left wrist 04/07/2019   Left wrist pain 03/14/2019   DDD (degenerative disc disease), cervical 05/08/2015   Neck muscle spasm 05/08/2015   Derangement of medial meniscus 06/04/2012    Past Surgical History:  Procedure Laterality Date   COLONOSCOPY WITH PROPOFOL  N/A 02/22/2016   Procedure: COLONOSCOPY WITH PROPOFOL ;  Surgeon: Gladis RAYMOND Mariner, MD;  Location: [facility] ENDOSCOPY;  Service: Endoscopy;  Laterality: N/A;   MENISCUS REPAIR Left    ROTATOR CUFF REPAIR Right    TONSILLECTOMY      Prior to Admission medications   Medication Sig Start Date End Date Taking? Authorizing Provider  atorvastatin  (LIPITOR) 40 MG tablet TAKE 1 TABLET BY MOUTH EVERY DAY 04/07/24  Yes Claudene Tanda POUR, PA-C  Multiple Vitamins-Minerals (MULTIVITAMIN PO) Take by mouth.   Yes [provider]  tadalafil  (CIALIS ) 5 MG tablet Take 0.5-1 tablets (2.5-5 mg total) by mouth daily as needed for erectile dysfunction (take 1 hour prior to sexual activity). 08/04/23  Yes Francisca Redell BROCKS, MD     Allergies Sulfa antibiotics  Family History  Problem Relation Age of Onset   Diabetes Father    Melanoma Father    Hypothyroidism Father    Macular degeneration Mother     Social History Social History   Tobacco Use   Smoking status: Never   Smokeless tobacco: Never  Substance Use Topics   Alcohol use: No   Drug use: No    Review of Systems Constitutional: No fever/chills Eyes: No visual changes. ENT: No sore throat. Cardiovascular: Denies chest pain. Respiratory: Denies shortness of breath. Gastrointestinal: No abdominal pain.  No nausea, no vomiting.  No diarrhea.  No constipation. Genitourinary: Negative for dysuria. Musculoskeletal: Negative for back pain. Skin: Negative for rash. Neurological: Negative for headaches, focal weakness or numbness.  Endocrine: Hyperlipidemia  Allergic/Immunilogical: Sulfa antibiotics ____________________________________________   PHYSICAL EXAM:  VITAL SIGNS: BP 120/78  Pulse Rate 82  Temp 98 F (36.7 C)  Weight 221 lb (100.2 kg)  Height 6' 2 (1.88 m)  Resp 76  SpO2 96 %   Constitutional: Alert and oriented. Well appearing and in no acute distress. Eyes: Conjunctivae are normal. PERRL. EOMI. Head: Atraumatic. Nose: No congestion/rhinnorhea. Mouth/Throat: Mucous membranes are moist.  Oropharynx non-erythematous. Neck: No stridor.  No cervical spine tenderness to palpation. Hematological/Lymphatic/Immunilogical: No cervical lymphadenopathy. Cardiovascular: Normal rate, regular rhythm. Grossly normal heart sounds.  Good peripheral circulation. Respiratory: Normal respiratory effort.  No retractions. Lungs CTAB. Gastrointestinal: Soft and nontender. No distention. No abdominal bruits. No CVA tenderness.  Genitourinary: Deferred Musculoskeletal: No lower extremity tenderness nor edema.  No joint effusions. Neurologic:  Normal speech and language. No gross focal neurologic deficits are appreciated. No gait  instability. Skin:  Skin is warm, dry and intact. No rash noted. Psychiatric: Mood and affect are normal. Speech and behavior are normal.  ____________________________________________   LABS           Component Ref Range & Units (hover) 7 d ago (04/07/24) 3 mo ago (12/17/23) 1 yr ago (04/10/23) 2 yr ago (04/04/22) 2 yr ago (02/03/22) 3 yr ago (04/02/21) 4 yr ago (03/29/20)  Color, UA Yellow yellow Yellow dark yellow yellow YELLOW Dark Yellow  Clarity, UA Clear clear Clearn clear clear clear Clear  Glucose, UA Negative Negative Negative Negative Negative Negative Negative  Bilirubin, UA Negative neg Negative negative negative negative Negative  Ketones, UA Negative neg Negative negative negative negative Negative  Spec Grav, UA 1.015 1.020 1.015 1.025 1.020 1.015 1.015  Blood, UA Negative neg Negative negative negativ e negative Negative  pH, UA 7.0 6.0 7.5 6.0 6.0 7.5 7.0  Protein, UA Positive Abnormal  Negative Negative Positive Abnormal  CM Negative Negative Negative  Comment: 1+  Urobilinogen, UA 0.2 0.2 0.2 0.2 1.0 0.2 0.2  Nitrite, UA Negative neg Negative negative negativ e negative Negative  Leukocytes, UA Negative Negative Negative Negative Negative Negative Negative  Appearance      clear   Odor                            Component Ref Range & Units (hover) 7 d ago (04/07/24) 1 yr ago (04/10/23) 1 yr ago (11/06/22) 2 yr ago (04/04/22) 3 yr ago (04/02/21) 3 yr ago (07/03/20) 4 yr ago (03/29/20)  Glucose 87 90  95 87 R  84 R  Uric Acid 5.7 6.3 CM  5.8 CM 6.3 CM  6.2 CM  Comment:            Therapeutic target for gout patients: <6.0  BUN 18 20  21 15  19   Creatinine, Ser 1.08 1.11  1.07 1.02  1.02  eGFR 79 76  81 86    BUN/Creatinine Ratio 17 18  20 15  19   Sodium 143 141  143 140  143  Potassium 4.7 4.5  4.3 4.4  4.9  Chloride 105 104  103 103  104  Calcium  9.0 9.0  9.2 8.9  9.5  Phosphorus 3.0 2.7 Low   3.5 3.0  3.5  Total Protein 6.5 6.6  6.9 6.9  6.8  Albumin  4.2 4.3  4.5 CM 4.5  4.3  Globulin, Total 2.3 2.3  2.4 2.4  2.5  Bilirubin Total 0.8 0.7  0.8 0.8  0.6  Alkaline Phosphatase 54 54  56 57  51 R  LDH 177 175  182 174  181  AST 24 18  27 20  21   ALT 27 22  36 30  21  GGT 22 23  33 25  21  Iron 115 93  121 169  190 High   Cholesterol, Total 130 120  151 138 149 239 High   Triglycerides 150 High  106  154 High  132 160 High  247 High   HDL 40 40  45 46 46 40  VLDL Cholesterol Cal 26 20  27 23 27  46 High   LDL Chol Calc (NIH) 64 60  79 69 76 153 High  Chol/HDL Ratio 3.3 3.0 CM  3.4 CM 3.0 CM 3.2 CM 6.0 High  CM  Comment:                                   T. Chol/HDL Ratio                                             Men  Women                               1/2 Avg.Risk  3.4    3.3                                   Avg.Risk  5.0    4.4                                2X Avg.Risk  9.6    7.1                                3X Avg.Risk 23.4   11.0  Estimated CHD Risk  < 0.5  < 0.5 CM  0.5 CM  < 0.5 CM  1.3 High  CM  Comment: The CHD Risk is based on the T. Chol/HDL ratio. Other factors affect CHD Risk such as hypertension, smoking, diabetes, severe obesity, and family history of premature CHD.  TSH 1.290 1.450 1.510 1.670 1.650  1.180  T4, Total 4.5 5.2  4.7 4.2 Low   4.8  T3 Uptake Ratio 32 30  29 26  31   Free Thyroxine Index 1.4 1.6  1.4 1.1 Low   1.5  Prostate Specific Ag, Serum 1.3 1.0 CM  0.9 CM 0.8 CM  0.7 CM  Comment: Roche ECLIA methodology. According to the American Urological Association, Serum PSA should decrease and remain at undetectable levels after radical prostatectomy. The AUA defines biochemical recurrence as an initial PSA value 0.2 ng/mL or greater followed by a subsequent confirmatory PSA value 0.2 ng/mL or greater. Values obtained with different assay methods or kits cannot be used interchangeably. Results cannot be interpreted as absolute evidence of the presence or absence of malignant disease.  WBC 5.6 5.1  5.6  5.1  5.3  RBC 5.16 5.22  5.36 5.28  5.37  Hemoglobin 16.2 16.4  16.6 16.7  16.6  Hematocrit 49.1 47.3  49.9 48.9  49.3  MCV 95 91  93 93  92  MCH 31.4 31.4  31.0 31.6  30.9  MCHC 33.0 34.7  33.3 34.2  33.7  RDW 12.2 12.0  12.4 12.4  12.4  Platelets 223 217  242 222  240  Neutrophils 54 54  54 48  45  Lymphs 31 31  32 35  34  Monocytes 11 11  10 11  11   Eos 3 3  3 5  8   Basos 1 1  1 1  2   Neutrophils Absolute 3.0 2.8  2.9 2.4  2.4  Lymphocytes Absolute 1.7 1.6  1.8 1.8  1.8  Monocytes Absolute 0.6 0.5  0.6  0.6  0.6  EOS (ABSOLUTE) 0.2 0.2  0.2 0.3  0.4  Basophils Absolute 0.1 0.1  0.1 0.1  0.1  Immature Granulocytes 0 0  0 0  0  Immature Grans (Abs) 0.0 0.0  0.0 0.0  0.0             ____________________________________________  EKG Sinus rhythm at 63 bpm  ____________________________________________    ____________________________________________   INITIAL IMPRESSION / ASSESSMENT AND PLAN  As part of my medical decision making, I reviewed the following data within the electronic MEDICAL RECORD NUMBER      No acute findings on physical exam, EKG, or labs.        ____________________________________________   FINAL CLINICAL IMPRESSION Well exam   ED Discharge Orders     None        Note:  This document was prepared using Dragon voice recognition software and may include unintentional dictation errors.

## 2024-04-14 NOTE — Progress Notes (Signed)
 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  Here for annual physical with only complaint is GERD which is new.  Stated he doesn't enjoy taking meds so he will take a Pepcid when he thinks he will eat something spicy experiencing it approximately 3x week.

## 2024-07-07 DIAGNOSIS — D225 Melanocytic nevi of trunk: Secondary | ICD-10-CM | POA: Diagnosis not present

## 2024-07-07 DIAGNOSIS — L820 Inflamed seborrheic keratosis: Secondary | ICD-10-CM | POA: Diagnosis not present

## 2024-07-07 DIAGNOSIS — L821 Other seborrheic keratosis: Secondary | ICD-10-CM | POA: Diagnosis not present

## 2024-07-07 DIAGNOSIS — D485 Neoplasm of uncertain behavior of skin: Secondary | ICD-10-CM | POA: Diagnosis not present

## 2024-07-07 DIAGNOSIS — Z85820 Personal history of malignant melanoma of skin: Secondary | ICD-10-CM | POA: Diagnosis not present

## 2024-07-07 DIAGNOSIS — D2262 Melanocytic nevi of left upper limb, including shoulder: Secondary | ICD-10-CM | POA: Diagnosis not present

## 2024-07-07 DIAGNOSIS — D2261 Melanocytic nevi of right upper limb, including shoulder: Secondary | ICD-10-CM | POA: Diagnosis not present

## 2024-07-07 DIAGNOSIS — D2272 Melanocytic nevi of left lower limb, including hip: Secondary | ICD-10-CM | POA: Diagnosis not present

## 2024-07-07 DIAGNOSIS — D2271 Melanocytic nevi of right lower limb, including hip: Secondary | ICD-10-CM | POA: Diagnosis not present

## 2024-07-07 DIAGNOSIS — Z86006 Personal history of melanoma in-situ: Secondary | ICD-10-CM | POA: Diagnosis not present

## 2024-07-07 DIAGNOSIS — Z08 Encounter for follow-up examination after completed treatment for malignant neoplasm: Secondary | ICD-10-CM | POA: Diagnosis not present

## 2024-07-07 DIAGNOSIS — L570 Actinic keratosis: Secondary | ICD-10-CM | POA: Diagnosis not present

## 2024-07-14 ENCOUNTER — Encounter: Payer: Self-pay | Admitting: Physician Assistant

## 2024-07-14 ENCOUNTER — Ambulatory Visit: Payer: Self-pay | Admitting: Physician Assistant

## 2024-07-14 VITALS — BP 120/80 | HR 77 | Temp 97.8°F | Resp 16

## 2024-07-14 DIAGNOSIS — G8929 Other chronic pain: Secondary | ICD-10-CM

## 2024-07-14 DIAGNOSIS — M25562 Pain in left knee: Secondary | ICD-10-CM | POA: Insufficient documentation

## 2024-07-14 NOTE — Progress Notes (Signed)
 Referral sent to Dr. Lamar Millman orthopedic surgeon for evaluation of left knee.

## 2024-07-14 NOTE — Addendum Note (Signed)
 Addended by: GENIA MINI on: 07/14/2024 09:10 AM   Modules accepted: Orders

## 2024-07-14 NOTE — Progress Notes (Signed)
 Here to follow up med and complains of left knee bothering him more with long hx of left knee procedures with orthopedic and stated he wishes to pursue evaluation of left knee with ortho and possible joint replacement when needed.

## 2024-07-14 NOTE — Progress Notes (Signed)
   Subjective: GERD and left knee pain    Patient ID: Kevin Berry, male    DOB: 28-Mar-1966, 58 y.o.   MRN: [id number]  HPI Patient follow-up 3 months for GERD which was a chief concern on his last exam.  Patient states almost complete resolution using over-the-counter Pepcid.  Patient also request orthopedic consultation secondary to chronic left knee pain.  Patient has a history of 2 meniscus repairs.  Last seen October 16, 2017 by [facility] orthopedic surgeon.  States orthopedic surgeon has retired and requests consult to new orthopedic services.  Review of Systems Derangement of medial meniscus left leg.  Hyperlipidemia.    Objective:   Physical Exam BP 120/80  Pulse Rate 77  Temp 97.8 F (36.6 C)  Resp 16  SpO2 98 %  Physical exam of the left knee was deferred.  Discussed rationale for not x-ray and the knee at this time.       Assessment & Plan: Chronic left knee pain   Will consult patient to orthopedic of choice for definitive evaluation and treatment.

## 2024-07-22 DIAGNOSIS — M1712 Unilateral primary osteoarthritis, left knee: Secondary | ICD-10-CM | POA: Diagnosis not present

## 2024-09-08 ENCOUNTER — Other Ambulatory Visit: Payer: Self-pay | Admitting: Urology

## 2024-09-13 ENCOUNTER — Other Ambulatory Visit: Payer: Self-pay | Admitting: Urology

## 2024-09-29 ENCOUNTER — Ambulatory Visit (INDEPENDENT_AMBULATORY_CARE_PROVIDER_SITE_OTHER): Admitting: Physician Assistant

## 2024-09-29 VITALS — BP 136/87 | HR 67 | Ht 74.0 in | Wt 220.0 lb

## 2024-09-29 DIAGNOSIS — N529 Male erectile dysfunction, unspecified: Secondary | ICD-10-CM | POA: Diagnosis not present

## 2024-09-29 MED ORDER — TADALAFIL 5 MG PO TABS: 2.5000 mg | ORAL_TABLET | Freq: Every day | ORAL | 11 refills | Status: AC | PRN

## 2024-09-29 NOTE — Progress Notes (Signed)
 "  09/29/2024 10:53 AM   Kevin Berry 02/15/1966 [id number]  CC: Chief Complaint  Patient presents with   Medication Refill   HPI: Kevin Berry is a 59 y.o. male with PMH ED on low-dose daily tadalafil  who presents today for annual follow-up.   Today he reports he takes 2.5 mg tadalafil  daily earlier in the week, and augments to 5 mg tadalafil  daily by Thursday or Friday.  His erections are very well-controlled with this regimen, he is pleased, and he wishes to continue this.  No priapism.  SHIM 24 as below.   SHIM     Row Name 09/29/24 0930         SHIM: Over the last 6 months:   How do you rate your confidence that you could get and keep an erection? High     When you had erections with sexual stimulation, how often were your erections hard enough for penetration (entering your partner)? Almost Always or Always     During sexual intercourse, how often were you able to maintain your erection after you had penetrated (entered) your partner? Almost Always or Always     During sexual intercourse, how difficult was it to maintain your erection to completion of intercourse? Not Difficult     When you attempted sexual intercourse, how often was it satisfactory for you? Almost Always or Always       SHIM Total Score   SHIM 24         PMH: Past Medical History:  Diagnosis Date   Acute medial meniscus tear of left knee    Hypercholesterolemia    Overweight    Plantar fasciitis    Rotator cuff tear 01/2013    Surgical History: Past Surgical History:  Procedure Laterality Date   COLONOSCOPY WITH PROPOFOL  N/A 02/22/2016   Procedure: COLONOSCOPY WITH PROPOFOL ;  Surgeon: Kevin RAYMOND Mariner, MD;  Location: [facility] ENDOSCOPY;  Service: Endoscopy;  Laterality: N/A;   MENISCUS REPAIR Left    ROTATOR CUFF REPAIR Right    TONSILLECTOMY      Home Medications:  Allergies as of 09/29/2024       Reactions   Sulfa Antibiotics Other (See Comments), Rash, Swelling   Fever Fever Fever         Medication List        Accurate as of September 29, 2024 10:53 AM. If you have any questions, ask your nurse or doctor.          atorvastatin  40 MG tablet Commonly known as: LIPITOR TAKE 1 TABLET BY MOUTH EVERY DAY   MULTIVITAMIN PO Take by mouth.   tadalafil  5 MG tablet Commonly known as: CIALIS  Take 0.5-1 tablets (2.5-5 mg total) by mouth daily as needed for erectile dysfunction (take 1 hour prior to sexual activity).        Allergies:  Allergies[1]  Family History: Family History  Problem Relation Age of Onset   Diabetes Father    Melanoma Father    Hypothyroidism Father    Macular degeneration Mother     Social History:   reports that he has never smoked. He has never used smokeless tobacco. He reports that he does not drink alcohol and does not use drugs.  Physical Exam: BP 136/87   Pulse 67   Ht 6' 2 (1.88 m)   Wt 220 lb (99.8 kg)   BMI 28.25 kg/m   Constitutional:  Alert and oriented, no acute distress, nontoxic appearing HEENT: [place], AT Cardiovascular: No  clubbing, cyanosis, or edema Respiratory: Normal respiratory effort, no increased work of breathing Skin: No rashes, bruises or suspicious lesions Neurologic: Grossly intact, no focal deficits, moving all 4 extremities Psychiatric: Normal mood and affect  Assessment & Plan:   1. Erectile dysfunction, unspecified erectile dysfunction type (Primary) Well-controlled on low-dose daily tadalafil  2.5 to 5 mg.  Will continue this and see him back for annual follow-up. - tadalafil  (CIALIS ) 5 MG tablet; Take 0.5-1 tablets (2.5-5 mg total) by mouth daily as needed for erectile dysfunction (take 1 hour prior to sexual activity).  Dispense: 30 tablet; Refill: 11   Return in about 1 year (around 09/29/2025) for Annual SHIM.  Lucie Hones, PA-C  [facility] [place] [address], [address] [place], [place] [address] [phone]     [1]  Allergies Allergen Reactions    Sulfa Antibiotics Other (See Comments), Rash and Swelling    Fever Fever Fever    "

## 2025-09-29 ENCOUNTER — Ambulatory Visit: Admitting: Physician Assistant
